# Patient Record
Sex: Male | Born: 1945
Health system: Southern US, Community
[De-identification: ages and names within clinical notes are randomized; demographics above are authoritative.]

## PROBLEM LIST (undated history)

## (undated) DIAGNOSIS — M1990 Unspecified osteoarthritis, unspecified site: Secondary | ICD-10-CM

## (undated) DIAGNOSIS — R112 Nausea with vomiting, unspecified: Secondary | ICD-10-CM

## (undated) DIAGNOSIS — C801 Malignant (primary) neoplasm, unspecified: Secondary | ICD-10-CM

## (undated) DIAGNOSIS — G8929 Other chronic pain: Secondary | ICD-10-CM

## (undated) DIAGNOSIS — M549 Dorsalgia, unspecified: Secondary | ICD-10-CM

## (undated) DIAGNOSIS — I10 Essential (primary) hypertension: Secondary | ICD-10-CM

## (undated) DIAGNOSIS — Z9889 Other specified postprocedural states: Secondary | ICD-10-CM

## (undated) DIAGNOSIS — R1310 Dysphagia, unspecified: Secondary | ICD-10-CM

## (undated) DIAGNOSIS — K5792 Diverticulitis of intestine, part unspecified, without perforation or abscess without bleeding: Secondary | ICD-10-CM

## (undated) DIAGNOSIS — K922 Gastrointestinal hemorrhage, unspecified: Secondary | ICD-10-CM

## (undated) DIAGNOSIS — Z9289 Personal history of other medical treatment: Secondary | ICD-10-CM

## (undated) HISTORY — PX: APPENDECTOMY: SHX54

## (undated) HISTORY — DX: Gastrointestinal hemorrhage, unspecified: K92.2

## (undated) HISTORY — DX: Dorsalgia, unspecified: M54.9

## (undated) HISTORY — PX: COLONOSCOPY W/ BIOPSIES AND POLYPECTOMY: SHX1376

## (undated) HISTORY — DX: Other chronic pain: G89.29

## (undated) HISTORY — DX: Essential (primary) hypertension: I10

## (undated) HISTORY — PX: CHOLECYSTECTOMY: SHX55

## (undated) HISTORY — PX: HERNIA REPAIR: SHX51

---

## 1997-06-14 ENCOUNTER — Ambulatory Visit (HOSPITAL_COMMUNITY): Admission: RE | Admit: 1997-06-14 | Discharge: 1997-06-14 | Payer: Self-pay | Admitting: Gastroenterology

## 1998-02-19 HISTORY — PX: CERVICAL FUSION: SHX112

## 1999-03-08 ENCOUNTER — Encounter: Payer: Self-pay | Admitting: Neurosurgery

## 1999-03-10 ENCOUNTER — Encounter: Payer: Self-pay | Admitting: Neurosurgery

## 1999-03-10 ENCOUNTER — Inpatient Hospital Stay (HOSPITAL_COMMUNITY): Admission: RE | Admit: 1999-03-10 | Discharge: 1999-03-10 | Payer: Self-pay | Admitting: Neurosurgery

## 1999-03-27 ENCOUNTER — Encounter: Admission: RE | Admit: 1999-03-27 | Discharge: 1999-03-27 | Payer: Self-pay | Admitting: Neurosurgery

## 1999-03-27 ENCOUNTER — Encounter: Payer: Self-pay | Admitting: Neurosurgery

## 1999-05-11 ENCOUNTER — Encounter: Payer: Self-pay | Admitting: Neurosurgery

## 1999-05-11 ENCOUNTER — Encounter: Admission: RE | Admit: 1999-05-11 | Discharge: 1999-05-11 | Payer: Self-pay | Admitting: Neurosurgery

## 2000-03-29 ENCOUNTER — Ambulatory Visit (HOSPITAL_COMMUNITY): Admission: RE | Admit: 2000-03-29 | Discharge: 2000-03-29 | Payer: Self-pay | Admitting: Gastroenterology

## 2006-02-19 DIAGNOSIS — K922 Gastrointestinal hemorrhage, unspecified: Secondary | ICD-10-CM

## 2006-02-19 HISTORY — DX: Gastrointestinal hemorrhage, unspecified: K92.2

## 2008-01-21 ENCOUNTER — Encounter: Admission: RE | Admit: 2008-01-21 | Discharge: 2008-01-21 | Payer: Self-pay | Admitting: Surgery

## 2009-06-23 ENCOUNTER — Encounter: Payer: Self-pay | Admitting: Cardiology

## 2010-07-07 NOTE — Procedures (Signed)
[place]  Patient:    Jeffery Harvey, Jeffery Harvey                     MRN: [id number] Proc. Date: 03/29/00 Adm. Date:  [id number] Attending:  Orland Mustard CC:         Teena Irani. Arlyce Dice, M.D.                           Procedure Report  PROCEDURE:  Colonoscopy.  MEDICATIONS:  Fentanyl 100 mcg, Versed 10 mg IV.  ENDOSCOPE:  Adult Olympus video colonoscope.  INDICATIONS:  Done as a follow-up fora history of previous colonic polyps.  DESCRIPTION OF PROCEDURE:  The procedure had been explained to the patient and consent obtained.  Withthe patient in the left lateral decubitus position, the Olympus adult video colonoscope inserted, advanced under direct visualization.  The prep was excellent, and we advanced to the cecum without difficulty.  The cecum was identified by the identification of the ileocecal valve and appendiceal orifice.  The scope was then withdrawn.  The cecum, ascending colon,hepatic flexure, splenic flexure, descending, and sigmoid colon all seenwell with no further polyps.  The scope was withdrawn, and the patient tolerated the procedure well, was maintained on low-flow oxygen and pulse oximetry throughout the procedure with no obvious problems.  ASSESSMENT:  No evidence of further colon polyps.  PLAN:  Will recommend repeating in three years and will go ahead with yearly Hemoccults. DD:  03/29/00 TD:  03/30/00 Job: [id number]

## 2010-07-07 NOTE — H&P (Signed)
[place]  Patient:    Jeffery Harvey                      MRN: [id number] Adm. Date:  [id number] Attending:  Danella Penton Dictator:   Tanya Nones. Jeral Fruit, M.D.                         History and Physical  CHIEF COMPLAINT:  Mr. Jeffery Harvey is a gentleman who was seen by me a year ago because of neck pain with painful sensation and weakness with discomfort going to the left  upper extremity.  HISTORY OF PRESENT ILLNESS:  At the beginning he had a complete cardiovascular work-up, which was essentially negative.  He had an MRI and he was sent to [place] for evaluation.  I saw him on June 17, 1998 and I followed him in my office on several occasions.  Nevertheless, he continued to have a lot of weakness of the triceps but was reluctant to go ahead with surgery.  A few days ago he came to my office and, indeed, he was not any better, probably a bit weaker.  He decided to go ahead with surgery.  He denies any pain in the right upper extremity, any pain in the lower extremity, or any problem with bladder or bowel.  PAST MEDICAL HISTORY:  Appendectomy five years ago.  He has a peptic ulcer and as had a polyp removed from the colon.  ALLERGIES:  No known drug allergies.  SOCIAL HISTORY:  He does not smoke and does not drink.  FAMILY HISTORY:  There is a history of cancer of the colon and strokes in his family.  REVIEW OF SYSTEMS:  He has a history of hearing loss, broken ankle.  He has indigestion.  Occasionally he has lower back pain.  PHYSICAL EXAMINATION:  HEENT:  Normal.  NECK:  He is able to flex, extensor lateralization produces pain that goes to the left shoulder.  There are no bruits in the carotids.  LUNGS:  Clear.  HEART:  Heart sounds normal.  ABDOMEN:  Normal.  EXTREMITIES:  Normal pulses.  NEUROLOGIC:  Mental status normal.  Cranial nerves normal.  Strength in the right upper extremity normal, in the left upper  extremity deltoid, biceps, and wrist extensors normal.  He has 2/5 weakness of the left triceps, weakness of the hyperthenar muscle.  Reflexes are symmetrical with absent at the left triceps.  Sensation normal although he complains of some tingling sensation going to the eft arm.  Coordination and gait normal.  The MRI showed that indeed this gentleman has at the level of C6-7 spondylosis ith a herniated disk, central and to the left.  CLINICAL IMPRESSION: 1.  C6-C7 herniated disk with left C7 radiculopathy, chronic.  RECOMMENDATIONS:  The patient is being admitted for surgery.  The procedure will be anterior C6-7 diskectomy followed by fusion.  He knows about the risks such as infection, CSF leak, worsening of pain, paralysis, stroke, damage to the vocal cord, damage to the esophagus. DD:  03/10/99 TD:  03/10/99 Job: [id number]

## 2010-11-15 DIAGNOSIS — M545 Low back pain, unspecified: Secondary | ICD-10-CM | POA: Insufficient documentation

## 2011-09-07 ENCOUNTER — Encounter: Payer: Self-pay | Admitting: Cardiology

## 2011-10-08 ENCOUNTER — Encounter: Payer: Self-pay | Admitting: Nurse Practitioner

## 2011-10-08 ENCOUNTER — Ambulatory Visit (INDEPENDENT_AMBULATORY_CARE_PROVIDER_SITE_OTHER): Payer: Medicare Other | Admitting: Nurse Practitioner

## 2011-10-08 VITALS — BP 140/90 | HR 51 | Ht 75.0 in | Wt 209.4 lb

## 2011-10-08 DIAGNOSIS — I10 Essential (primary) hypertension: Secondary | ICD-10-CM

## 2011-10-08 MED ORDER — NEBIVOLOL HCL 10 MG PO TABS: 10.0000 mg | ORAL_TABLET | Freq: Every day | ORAL | Status: DC

## 2011-10-08 MED ORDER — OLMESARTAN MEDOXOMIL 40 MG PO TABS: 40.0000 mg | ORAL_TABLET | Freq: Every day | ORAL | Status: DC

## 2011-10-08 NOTE — Patient Instructions (Signed)
Goal blood pressure is less than 135/85.   Lets stop the Benicar HCT  We will use plain Benicar 40 mg a day  We will increase the Bystolic to 10 mg a day.  Monitor your blood pressure and heart rate at home and keep a diary  Minimize your salt use.  Call us for any lightheadedness or dizziness  Call the [facility] office at [phone] if you have any questions, problems or concerns.

## 2011-10-08 NOTE — Assessment & Plan Note (Signed)
Blood pressure is up here today. He has not been checking at home. He would like to come off the HCTZ to see if his gout symptoms will improve. I have changed him to plain Benicar 40 mg. I have increased his Bystolic to 10 mg. Hopefully we will not see worsening bradycardia or symptoms. I have asked him to start monitoring his blood pressure and pulse at home. He sees [name] in 4 weeks and he was given samples today to last until that visit. He has not tolerated Lotrel in the past due to swelling, so I would avoid Norvasc. Could consider Hydralazine if needed. He understands the need for sodium restriction. Patient is agreeable to this plan and will call if any problems develop in the interim.

## 2011-10-08 NOTE — Progress Notes (Signed)
   Jeffery Harvey Date of Birth: [birth year] Medical Record #[id number]  History of Present Illness: Jeffery Harvey is seen back today for a work in visit. He is seen for [name], whom he will meet next month for consultation. He is a former patient of [name]. He has HTN, gout and occasion GERD. He has no known CAD.   He comes in today. It has been recommended that he come off of his HCTZ due to his gout. He continues to have flareups. He is on Uloric. He will use Indocin prn. He has no chest pain or shortness of breath. He has lost weight since his last visit with [name] in May of 2011. Weight then was 224. He has stopped checking his blood pressure at home. He tolerates his medicines well. Not lightheaded or dizzy. He does admit to excessive salt use. No swelling reported. He had complete lab work with [name] about a month ago.   Current Outpatient Prescriptions on File Prior to Visit  Medication Sig Dispense Refill  . Febuxostat (ULORIC) 80 MG TABS Take 80 mg by mouth 2 (two) times daily.      . INDOMETHACIN PO Take 50 mg by mouth 2 (two) times daily. PT ON TWICE DAILY FOR 1 MORE WEEK THEN WILL RESUME TO DAILY      . ranitidine (ZANTAC) 150 MG tablet Take 150 mg by mouth as needed.      . zolpidem (AMBIEN) 10 MG tablet Take 10 mg by mouth at bedtime as needed.      Marland Kitchen olmesartan (BENICAR) 40 MG tablet Take 1 tablet (40 mg total) by mouth daily.  30 tablet  11    Allergies  Allergen Reactions  . Amlodipine Besy-Benazepril Hcl   . Vioxx (Rofecoxib)     Past Medical History  Diagnosis Date  . GI bleed   . Gout   . Hypertension     Past Surgical History  Procedure Date  . Cervical fusion 2000  . Hernia repair     Bilateral  . Appendectomy   . Colonoscopy w/ biopsies and polypectomy     History  Smoking status  . Former Smoker  . Types: Cigarettes  Smokeless tobacco  . Not on file    History  Alcohol Use  . 2.5 oz/week  . 5 drink(s) per week    WINE     Family History  Problem Relation Age of Onset  . Other Father 2    Blood Disorder  . Heart failure Mother 53  . Colon cancer Mother   . Hypertension Sister 78    Valve repaired    Review of Systems: The review of systems is per the HPI.  All other systems were reviewed and are negative.  Physical Exam: BP 140/90  Pulse 51  Ht 6\' 3"  (1.905 m)  Wt 209 lb 6.4 oz (94.983 kg)  BMI 26.17 kg/m2 Weight is down from 224.  Patient is very pleasant and in no acute distress. Skin is warm and dry. Color is normal.  HEENT is unremarkable. Normocephalic/atraumatic. PERRL. Sclera are nonicteric. Neck is supple. No masses. No JVD. Lungs are clear. Cardiac exam shows a regular rate and rhythm. Abdomen is soft. Extremities are without edema. Gait and ROM are intact. No gross neurologic deficits noted.   LABORATORY DATA: N/A  EKG today shows sinus bradycardia.   Assessment / Plan:

## 2011-11-05 ENCOUNTER — Telehealth: Payer: Self-pay | Admitting: Cardiology

## 2011-11-05 DIAGNOSIS — I119 Hypertensive heart disease without heart failure: Secondary | ICD-10-CM

## 2011-11-05 MED ORDER — HYDRALAZINE HCL 25 MG PO TABS: 25.0000 mg | ORAL_TABLET | Freq: Two times a day (BID) | ORAL | Status: DC

## 2011-11-05 NOTE — Telephone Encounter (Signed)
New problem:  Blood pressure recently  Changed by Jeffery Harvey in August.  No er visit. Medication was taken. H/O gout. Taken benicar  40 mg. bystolic was increase 10 mg.  No more sample of Benicar. Please advise.    B/p 180/114  Today - pulse rate 54   Yesterday  183/118. - pulse rate 46.

## 2011-11-05 NOTE — Telephone Encounter (Signed)
Has not tolerated Lotrel in the past. So would avoid Norvasc. We can try Hydralazine 25 mg BID. See on Thursday as planned with his readings.

## 2011-11-05 NOTE — Telephone Encounter (Signed)
Takes Bystolic at night with evening meal and Benicar in the am.   Blood pressure taken at different times during day and numbers are still high.  Is scheduled to see Dr Swaziland on Thursday.  Did take last Benicar this am and last Bystolic tonight.  Is very concerned about how high blood pressure is.  Will forward to [facility] for review.  Would like something else to help and would prefer something we have samples of.  Does have appointment this afternoon with PCP for problems with sleep, not sleeping well and is very stressed at work.

## 2011-11-05 NOTE — Telephone Encounter (Signed)
F/U  Wife calling back regarding samples.   Wife would like a call back.

## 2011-11-05 NOTE — Telephone Encounter (Signed)
Advised wife and put samples up front.  Wife will bring readings and blood pressure machine to appointment with Dr Swaziland on 9/19 Benicar 40 mg samples lot # [id number] exp 9/15 x 3 Bystolic 10 mg  Lot # [id number] exp 5/15

## 2011-11-08 ENCOUNTER — Encounter: Payer: Self-pay | Admitting: Cardiology

## 2011-11-08 ENCOUNTER — Ambulatory Visit (INDEPENDENT_AMBULATORY_CARE_PROVIDER_SITE_OTHER): Payer: Medicare Other | Admitting: Cardiology

## 2011-11-08 VITALS — BP 110/80 | HR 54 | Ht 75.0 in | Wt 201.0 lb

## 2011-11-08 DIAGNOSIS — M109 Gout, unspecified: Secondary | ICD-10-CM

## 2011-11-08 DIAGNOSIS — I10 Essential (primary) hypertension: Secondary | ICD-10-CM

## 2011-11-08 NOTE — Progress Notes (Signed)
Jeffery Harvey Date of Birth: 02-Dec-1945 Medical Record #[id number]  History of Present Illness: Jeffery Harvey is seen back today for a followup visit. He has HTN, gout and occasion GERD. He has no known CAD. Echocardiogram and Cardiolite studies in May of 2012 are unremarkable. He was seen one month ago. At that time he had increased difficulty with gout. His HCTZ was discontinued. He was more hypertensive. His bystolic was increased to 10 mg daily. He has remained on Benicar 40 mg daily. His blood pressure has continued to fluctuate. This past weekend his blood pressure increased significantly up to 180/114. He does note that he has been taking Indocin daily for the past 3 months. This was discontinued. He was started on hydralazine 3 days ago and since then his blood pressure has improved. He is feeling well. He has no chest pain or shortness of breath. He has been following a low-sodium diet and has reduced his caffeine intake.    Current Outpatient Prescriptions on File Prior to Visit  Medication Sig Dispense Refill  . Febuxostat (ULORIC) 80 MG TABS Take 80 mg by mouth daily.       . hydrALAZINE (APRESOLINE) 25 MG tablet Take 1 tablet (25 mg total) by mouth 2 (two) times daily.  60 tablet  5  . INDOMETHACIN PO Take 50 mg by mouth 2 (two) times daily. PT ON TWICE DAILY FOR 1 MORE WEEK THEN WILL RESUME TO DAILY      . nebivolol (BYSTOLIC) 10 MG tablet Take 1 tablet (10 mg total) by mouth daily.      Marland Kitchen olmesartan (BENICAR) 40 MG tablet Take 1 tablet (40 mg total) by mouth daily.  30 tablet  11  . ranitidine (ZANTAC) 150 MG tablet Take 150 mg by mouth as needed.      . zolpidem (AMBIEN) 10 MG tablet Take 10 mg by mouth at bedtime as needed.        Allergies  Allergen Reactions  . Amlodipine Besy-Benazepril Hcl   . Vioxx (Rofecoxib)     Past Medical History  Diagnosis Date  . GI bleed   . Gout   . Hypertension     Past Surgical History  Procedure Date  . Cervical fusion 2000  . Hernia  repair     Bilateral  . Appendectomy   . Colonoscopy w/ biopsies and polypectomy     History  Smoking status  . Former Smoker  . Types: Cigarettes  Smokeless tobacco  . Not on file    History  Alcohol Use  . 2.5 oz/week  . 5 drink(s) per week    WINE    Family History  Problem Relation Age of Onset  . Other Father 59    Blood Disorder  . Heart failure Mother 58  . Colon cancer Mother   . Hypertension Sister 44    Valve repaired    Review of Systems: The review of systems is per the HPI.  All other systems were reviewed and are negative.  Physical Exam: BP 110/80  Pulse 54  Ht 6\' 3"  (1.905 m)  Wt 201 lb (91.173 kg)  BMI 25.12 kg/m2 Weight is down from 224.  Patient is very pleasant and in no acute distress. Skin is warm and dry. Color is normal.  HEENT is unremarkable. Normocephalic/atraumatic. PERRL. Sclera are nonicteric. Neck is supple. No masses. No JVD. Lungs are clear. Cardiac exam shows a regular rate and rhythm. Abdomen is soft. Extremities are without edema. Gait and  ROM are intact. No gross neurologic deficits noted.   LABORATORY DATA:    Assessment / Plan: 1. Hypertension with recent acceleration. I suspect this may be related to his daily use of Indocin. Since his gout has improved we will discontinue this. We will continue with hydralazine as his blood pressure has improved. Continue his current doses of Benicar and Bystolic. I will have him followup again in one month. I've requested copies of his lab work from [name]. If his blood pressure remains significantly elevated we may need to consider evaluation for secondary causes of hypertension.

## 2011-11-08 NOTE — Patient Instructions (Signed)
Continue your current therapy  We will see you again in one month.

## 2011-11-12 ENCOUNTER — Telehealth: Payer: Self-pay | Admitting: Cardiology

## 2011-11-12 NOTE — Telephone Encounter (Signed)
New Problem:    Patient's wife called in wanting to speak with you about her husbands blood pressure medications.  After having lisinopril on Saturday his BP dropped down to 82/51.  Sunday his morning BP was 128/82, afternoon 111/69, evening101/62, did not take medication. This morning his BP it is 144/96 and still has not taken his medication today. Patient's wife wanted a call back today.  Please call back.

## 2011-11-12 NOTE — Telephone Encounter (Signed)
Spoke to patient's wife she stated patient's B/P dropped this weekend and he held his hydralazine.States B/P this past Sat morning when he got up was 102/69 P 55.After taking am meds B/P 82/51 p 61 pt felt light headed and dizzy,blurred vision.States at 6:30 pm Sat B/P 110/70,held pm dose of hydralazine,at 8:45 pm B/P 103/66.States Sunday morning B/P 120/82,4:45 pm yesterday B/P 101/62.States this morning B/P 144/96.States he did not take hydralazine,wants to know what he should do.Wife was told Dr.Jordan not in office today,will check with him tomorrow and call back.Wife will check patient's B/P when he comes home from work today and if still elevated she will give him his pm dose of hydralazine.

## 2011-11-13 NOTE — Telephone Encounter (Signed)
Patient called spoke to wife was told Dr.Jordan advised to decrease Hydralazine to 25mg  1/2 tablet twice daily.Advised to continue to monitor B/P and call back if needed.

## 2011-11-26 ENCOUNTER — Other Ambulatory Visit: Payer: Self-pay | Admitting: Cardiology

## 2011-11-26 ENCOUNTER — Other Ambulatory Visit: Payer: Self-pay

## 2011-11-26 MED ORDER — OLMESARTAN MEDOXOMIL 40 MG PO TABS: 40.0000 mg | ORAL_TABLET | Freq: Every day | ORAL | Status: DC

## 2011-12-06 ENCOUNTER — Ambulatory Visit (INDEPENDENT_AMBULATORY_CARE_PROVIDER_SITE_OTHER): Payer: Medicare Other | Admitting: Cardiology

## 2011-12-06 ENCOUNTER — Encounter: Payer: Self-pay | Admitting: Cardiology

## 2011-12-06 VITALS — BP 124/78 | HR 59 | Ht 72.0 in | Wt 200.8 lb

## 2011-12-06 DIAGNOSIS — I119 Hypertensive heart disease without heart failure: Secondary | ICD-10-CM

## 2011-12-06 DIAGNOSIS — I10 Essential (primary) hypertension: Secondary | ICD-10-CM

## 2011-12-06 MED ORDER — HYDRALAZINE HCL 25 MG PO TABS: 25.0000 mg | ORAL_TABLET | Freq: Two times a day (BID) | ORAL | Status: DC

## 2011-12-06 MED ORDER — CARVEDILOL 12.5 MG PO TABS: 12.5000 mg | ORAL_TABLET | Freq: Two times a day (BID) | ORAL | Status: DC

## 2011-12-06 NOTE — Patient Instructions (Signed)
When you run out of Bystolic we will switch to carvedilol 12.5 mg twice a day.  Continue otherwise what you are doing.  I will see you in 4 months

## 2011-12-06 NOTE — Progress Notes (Signed)
[name] Date of Birth: 1945/12/30 Medical Record #[id number]  History of Present Illness: Jeffery Harvey is seen back today for a followup visit. He has HTN, gout and occasion GERD. He has no known CAD. Echocardiogram and Cardiolite studies in May of 2012 are unremarkable. On followup today he reports that he is doing well. We placed him on hydralazine for blood pressure control. Initially he was having elevated blood pressure readings in the morning and then drops in his blood pressure in the evening. He was sometimes symptomatic with hypotension. He switched to taking his hydralazine  a half tablet in the morning and one tablet at night. Since then his blood pressure has been much better with fairly consistent control. His wife is concerned about the cost of his medication and wants to see about getting him on generic medications as much as possible.    Current Outpatient Prescriptions on File Prior to Visit  Medication Sig Dispense Refill  . Febuxostat (ULORIC) 80 MG TABS Take 80 mg by mouth daily.       Marland Kitchen olmesartan (BENICAR) 40 MG tablet Take 1 tablet (40 mg total) by mouth daily.  30 tablet  11  . oxyCODONE-acetaminophen (PERCOCET) 10-325 MG per tablet as directed.      . ranitidine (ZANTAC) 150 MG tablet Take 150 mg by mouth as needed.      . zolpidem (AMBIEN) 10 MG tablet Take 10 mg by mouth at bedtime as needed.      Marland Kitchen DISCONTD: hydrALAZINE (APRESOLINE) 25 MG tablet Take 1 tablet (25 mg total) by mouth 2 (two) times daily.  60 tablet  5  . carvedilol (COREG) 12.5 MG tablet Take 1 tablet (12.5 mg total) by mouth 2 (two) times daily.  180 tablet  3    Allergies  Allergen Reactions  . Amlodipine Besy-Benazepril Hcl   . Vioxx (Rofecoxib)     Past Medical History  Diagnosis Date  . GI bleed   . Gout   . Hypertension     Past Surgical History  Procedure Date  . Cervical fusion 2000  . Hernia repair     Bilateral  . Appendectomy   . Colonoscopy w/ biopsies and polypectomy       History  Smoking status  . Former Smoker  . Types: Cigarettes  Smokeless tobacco  . Not on file    History  Alcohol Use  . 2.5 oz/week  . 5 drink(s) per week    WINE    Family History  Problem Relation Age of Onset  . Other Father 79    Blood Disorder  . Heart failure Mother 4  . Colon cancer Mother   . Hypertension Sister 49    Valve repaired    Review of Systems: The review of systems is per the HPI.  All other systems were reviewed and are negative.  Physical Exam: BP 124/78  Pulse 59  Ht 6' (1.829 m)  Wt 200 lb 12.8 oz (91.082 kg)  BMI 27.23 kg/m2  SpO2 94% Weight is down from 224.  Patient is very pleasant and in no acute distress. Skin is warm and dry. Color is normal.  HEENT is unremarkable. Normocephalic/atraumatic. PERRL. Sclera are nonicteric. Neck is supple. No masses. No JVD. Lungs are clear. Cardiac exam shows a regular rate and rhythm. Abdomen is soft. Extremities are without edema. Gait and ROM are intact. No gross neurologic deficits noted.   LABORATORY DATA:    Assessment / Plan: 1. Hypertension blood pressure  has improved significantly with the addition of hydralazine. Given concerns about the cost of his medications we will try to switch his bystolic to carvedilol 12.5 mg twice a day. We will continue with Benicar for now. Losartan is the only generic ARB available and I think is clearly inferior in potency. I'll followup again in 4 months.

## 2012-03-20 ENCOUNTER — Encounter: Payer: Self-pay | Admitting: Cardiology

## 2012-04-01 ENCOUNTER — Ambulatory Visit: Payer: Medicare Other | Admitting: Cardiology

## 2012-04-24 ENCOUNTER — Ambulatory Visit (INDEPENDENT_AMBULATORY_CARE_PROVIDER_SITE_OTHER): Payer: Medicare Other | Admitting: Cardiology

## 2012-04-24 ENCOUNTER — Encounter: Payer: Self-pay | Admitting: Cardiology

## 2012-04-24 VITALS — BP 142/80 | HR 64 | Ht 74.0 in | Wt 204.1 lb

## 2012-04-24 DIAGNOSIS — I10 Essential (primary) hypertension: Secondary | ICD-10-CM

## 2012-04-24 NOTE — Progress Notes (Signed)
   [name] Date of Birth: 12/14/1945 Medical Record #[id number]  History of Present Illness: Jeffery Harvey is seen back today for a followup visit. He has HTN, gout and occasion GERD. He has no known CAD. Echocardiogram and Cardiolite studies in May of 2012 are unremarkable. On followup today he reports he has been under a lot of stress. He has had 2 injections recently for back pain with much improvement. Blood pressure readings over the last 2 months have been generally very good but have ranged as high as 160/106 and as low and 81/50.     Current Outpatient Prescriptions on File Prior to Visit  Medication Sig Dispense Refill  . carvedilol (COREG) 12.5 MG tablet Take 1 tablet (12.5 mg total) by mouth 2 (two) times daily.  180 tablet  3  . Febuxostat (ULORIC) 80 MG TABS Take 80 mg by mouth daily.       Marland Kitchen olmesartan (BENICAR) 40 MG tablet Take 1 tablet (40 mg total) by mouth daily.  30 tablet  11  . oxyCODONE-acetaminophen (PERCOCET) 10-325 MG per tablet as directed.      . ranitidine (ZANTAC) 150 MG tablet Take 150 mg by mouth as needed.      . zolpidem (AMBIEN) 10 MG tablet Take 10 mg by mouth at bedtime as needed.       No current facility-administered medications on file prior to visit.    Allergies  Allergen Reactions  . Amlodipine Besy-Benazepril Hcl   . Vioxx (Rofecoxib)     Past Medical History  Diagnosis Date  . GI bleed   . Gout   . Hypertension     Past Surgical History  Procedure Laterality Date  . Cervical fusion  2000  . Hernia repair      Bilateral  . Appendectomy    . Colonoscopy w/ biopsies and polypectomy      History  Smoking status  . Former Smoker  . Types: Cigarettes  Smokeless tobacco  . Not on file    History  Alcohol Use  . 2.5 oz/week  . 5 drink(s) per week    Comment: WINE    Family History  Problem Relation Age of Onset  . Other Father 16    Blood Disorder  . Heart failure Mother 26  . Colon cancer Mother   . Hypertension  Sister 61    Valve repaired    Review of Systems: The review of systems is per the HPI.  All other systems were reviewed and are negative.  Physical Exam: BP 142/80  Pulse 64  Ht 6\' 2"  (1.88 m)  Wt 204 lb 1.9 oz (92.588 kg)  BMI 26.2 kg/m2  SpO2 97% Patient is very pleasant and in no acute distress. Skin is warm and dry.  HEENT is unremarkable. Normocephalic/atraumatic. PERRL. Sclera are nonicteric. Neck is supple. No masses. No JVD. Lungs are clear. Cardiac exam shows a regular rate and rhythm. Abdomen is soft. Extremities are without edema. Gait and ROM are intact. No gross neurologic deficits noted.   LABORATORY DATA:    Assessment / Plan: 1. Hypertension- Blood pressure has improved. Will continue carvedilol, hydralazine and Benicar. If BP drops he is to hold hydralazine. Follow up in 6 months.

## 2012-04-24 NOTE — Patient Instructions (Signed)
Continue your current therapy.  If your blood pressure drops I would hold your next hydralazine dose.

## 2012-07-04 ENCOUNTER — Other Ambulatory Visit: Payer: Self-pay | Admitting: Cardiology

## 2012-07-07 ENCOUNTER — Other Ambulatory Visit: Payer: Self-pay

## 2012-07-07 NOTE — Telephone Encounter (Signed)
Assessment / Plan: 1. Hypertension- Blood pressure has improved. Will continue carvedilol, hydralazine and Benicar. If BP drops he is to hold hydralazine. Follow up in 6 months.     Patient Instructions  Continue your current therapy.  If your blood pressure drops I would hold your next hydralazine dose.  Chart Reviewed By  Charna Elizabeth, LPN  on 1/61/0960 11:47 AM     Previous Visit     Provider Department Encounter #  04/01/2012 10:00 AM Peter Swaziland, MD [facility] [id number]

## 2012-07-09 ENCOUNTER — Telehealth: Payer: Self-pay | Admitting: Cardiology

## 2012-07-09 MED ORDER — HYDRALAZINE HCL 25 MG PO TABS: 37.5000 mg | ORAL_TABLET | Freq: Every day | ORAL | Status: DC

## 2012-07-09 NOTE — Telephone Encounter (Signed)
Returned call to patient spoke to wife she stated husband needs refills on hydralazine.Refills sent to pharmacy.

## 2012-07-09 NOTE — Telephone Encounter (Signed)
New problem    Pt tried to get his BP Hydralazine 25mg  refill at the [facility] and he was told that Dr Swaziland said that he didn't need a refill on it. Please call pt because pt will run out today.

## 2012-09-24 ENCOUNTER — Other Ambulatory Visit: Payer: Self-pay

## 2012-10-23 ENCOUNTER — Encounter: Payer: Self-pay | Admitting: Cardiology

## 2012-10-23 ENCOUNTER — Ambulatory Visit (INDEPENDENT_AMBULATORY_CARE_PROVIDER_SITE_OTHER): Payer: Medicare Other | Admitting: Cardiology

## 2012-10-23 VITALS — BP 120/68 | HR 61 | Ht 74.0 in | Wt 209.8 lb

## 2012-10-23 DIAGNOSIS — I10 Essential (primary) hypertension: Secondary | ICD-10-CM

## 2012-10-23 NOTE — Patient Instructions (Signed)
Continue your current therapy  I will see you in 6 months.   

## 2012-10-23 NOTE — Progress Notes (Signed)
Jeffery Harvey Date of Birth: February 10, 1946 Medical Record #[id number]  History of Present Illness: Jeffery Harvey is seen back today for a followup visit. He has HTN, gout and occasion GERD. He has no known CAD. Echocardiogram and Cardiolite studies in May of 2012 are unremarkable. On followup today he reports he is doing very well. He denies any symptoms of chest pain or shortness of breath. He has been monitoring his blood pressure at home and it has been well controlled with typical readings running from 122-140 systolic. He rarely has episodes of hypotension. He was started on Cymbalta for back pain and he notes that this has helped him deal with stress.    Current Outpatient Prescriptions on File Prior to Visit  Medication Sig Dispense Refill  . carvedilol (COREG) 12.5 MG tablet Take 1 tablet (12.5 mg total) by mouth 2 (two) times daily.  180 tablet  3  . Febuxostat (ULORIC) 80 MG TABS Take 80 mg by mouth daily.       . hydrALAZINE (APRESOLINE) 25 MG tablet Take 1.5 tablets (37.5 mg total) by mouth daily. Take 0.5 tablet in the am and 1 tablet in the pm.  60 tablet  11  . olmesartan (BENICAR) 40 MG tablet Take 1 tablet (40 mg total) by mouth daily.  30 tablet  11  . oxyCODONE-acetaminophen (PERCOCET) 10-325 MG per tablet as directed.      . ranitidine (ZANTAC) 150 MG tablet Take 150 mg by mouth as needed.      . zolpidem (AMBIEN) 10 MG tablet Take 10 mg by mouth at bedtime as needed.       No current facility-administered medications on file prior to visit.    Allergies  Allergen Reactions  . Amlodipine Besy-Benazepril Hcl   . Vioxx [Rofecoxib]     Past Medical History  Diagnosis Date  . GI bleed   . Gout   . Hypertension     Past Surgical History  Procedure Laterality Date  . Cervical fusion  2000  . Hernia repair      Bilateral  . Appendectomy    . Colonoscopy w/ biopsies and polypectomy      History  Smoking status  . Former Smoker  . Types: Cigarettes  Smokeless  tobacco  . Not on file    History  Alcohol Use  . 2.5 oz/week  . 5 drink(s) per week    Comment: WINE    Family History  Problem Relation Age of Onset  . Other Father 50    Blood Disorder  . Heart failure Mother 26  . Colon cancer Mother   . Hypertension Sister 64    Valve repaired    Review of Systems: The review of systems is per the HPI.  All other systems were reviewed and are negative.  Physical Exam: BP 120/68  Pulse 61  Ht 6\' 2"  (1.88 m)  Wt 209 lb 12.8 oz (95.165 kg)  BMI 26.93 kg/m2 Patient is very pleasant and in no acute distress. Skin is warm and dry.  HEENT is unremarkable. Normocephalic/atraumatic. PERRL. Sclera are nonicteric. Neck is supple. No masses. No JVD. Lungs are clear. Cardiac exam shows a regular rate and rhythm. Abdomen is soft. Extremities are without edema. Gait and ROM are intact. No gross neurologic deficits noted.   LABORATORY DATA:  ECG today demonstrates normal sinus rhythm with a normal ECG. Rate is 61 beats per minute.  Assessment / Plan: 1. Hypertension- Blood pressure is well controlled. Will continue  carvedilol, hydralazine and Benicar.

## 2012-11-01 ENCOUNTER — Other Ambulatory Visit: Payer: Self-pay | Admitting: Cardiology

## 2012-12-12 ENCOUNTER — Other Ambulatory Visit: Payer: Self-pay | Admitting: Cardiology

## 2012-12-25 ENCOUNTER — Other Ambulatory Visit: Payer: Self-pay

## 2013-05-21 DIAGNOSIS — M533 Sacrococcygeal disorders, not elsewhere classified: Secondary | ICD-10-CM | POA: Insufficient documentation

## 2013-05-21 DIAGNOSIS — M47817 Spondylosis without myelopathy or radiculopathy, lumbosacral region: Secondary | ICD-10-CM | POA: Insufficient documentation

## 2013-05-21 DIAGNOSIS — M48061 Spinal stenosis, lumbar region without neurogenic claudication: Secondary | ICD-10-CM | POA: Insufficient documentation

## 2013-07-21 ENCOUNTER — Telehealth: Payer: Self-pay | Admitting: Cardiology

## 2013-07-21 NOTE — Telephone Encounter (Signed)
Returned call to patient's wife.She stated husband has been dieting and has lost 15 lbs within 3 weeks.Stated he has been dizzy.Stated B/P has been low ranging 80/50,90/54,97/61,pulse ranging in 60's.Stated husband has been holding B/P medications.Stated she made him a appointment with Truitt Merle NP 08/18/13,but wanted him seen sooner.Appointment scheduled with Dr.Jordan 07/22/13 at 4:30 pm.

## 2013-07-21 NOTE — Telephone Encounter (Signed)
New Message:  Pt's wife states in the past week her husband has lost about 15 lbs... States he has been really dizzy lately and has had low BP... 5/25 it was 94/51 with him taking his medication.. 5/30 it was 80/51 after taking his med...  5/31 at 10 am it was 101/68.. That afternoon 106/67 without taking his meds.. PT's wife is requesting a call back from [place] to discuss her husbands BP and to see if he can be worked in any sooner.

## 2013-07-22 ENCOUNTER — Ambulatory Visit (INDEPENDENT_AMBULATORY_CARE_PROVIDER_SITE_OTHER): Payer: Medicare Other | Admitting: Cardiology

## 2013-07-22 ENCOUNTER — Encounter: Payer: Self-pay | Admitting: Cardiology

## 2013-07-22 VITALS — BP 131/83 | HR 61 | Ht 74.0 in | Wt 210.0 lb

## 2013-07-22 DIAGNOSIS — I10 Essential (primary) hypertension: Secondary | ICD-10-CM

## 2013-07-22 NOTE — Patient Instructions (Signed)
Stop taking hydralazine   Continue your other therapy  I will see you in 6 months. Call if you continue to have low blood pressure.

## 2013-07-23 NOTE — Progress Notes (Signed)
   Jeffery Harvey Date of Birth: May 14, 1945 Medical Record #[id number]  History of Present Illness: Jeffery Harvey is seen back today for a followup visit. He has HTN, gout and occasion GERD. He has no known CAD. Echocardiogram and Cardiolite studies in May of 2012 are unremarkable. Recently he was following a more strict diet in order to lose weight. He has chronic back pain and was trying to do all he could to help. He lost 9 lbs. With this he also developed Hypotension with BP down to 80/51. He did not feel well with this and held his BP meds for a couple of days. BP has been well controlled. No complaints of chest pain or SOB.    Current Outpatient Prescriptions on File Prior to Visit  Medication Sig Dispense Refill  . BENICAR 40 MG tablet TAKE 1 TABLET EVERY DAY  30 tablet  11  . carvedilol (COREG) 12.5 MG tablet TAKE 1 TABLET BY MOUTH TWICE A DAY  180 tablet  2  . DULoxetine (CYMBALTA) 30 MG capsule Take 60 mg by mouth daily.       . Febuxostat (ULORIC) 80 MG TABS Take 80 mg by mouth daily.       . ranitidine (ZANTAC) 150 MG tablet Take 150 mg by mouth as needed.      . zolpidem (AMBIEN) 10 MG tablet Take 10 mg by mouth at bedtime as needed.       No current facility-administered medications on file prior to visit.    Allergies  Allergen Reactions  . Amlodipine Besy-Benazepril Hcl   . Vioxx [Rofecoxib]     Past Medical History  Diagnosis Date  . GI bleed   . Gout   . Hypertension   . Chronic back pain     Past Surgical History  Procedure Laterality Date  . Cervical fusion  2000  . Hernia repair      Bilateral  . Appendectomy    . Colonoscopy w/ biopsies and polypectomy      History  Smoking status  . Former Smoker  . Types: Cigarettes  Smokeless tobacco  . Not on file    History  Alcohol Use  . 2.5 oz/week  . 5 drink(s) per week    Comment: WINE    Family History  Problem Relation Age of Onset  . Other Father 2    Blood Disorder  . Heart failure Mother 58   . Colon cancer Mother   . Hypertension Sister 55    Valve repaired    Review of Systems: The review of systems is per the HPI.  All other systems were reviewed and are negative.  Physical Exam: BP 131/83  Pulse 61  Ht 6\' 2"  (1.88 m)  Wt 210 lb (95.255 kg)  BMI 26.95 kg/m2 Patient is very pleasant and in no acute distress. Skin is warm and dry.  HEENT is unremarkable. Normocephalic/atraumatic. PERRL. Sclera are nonicteric. Neck is supple. No masses. No JVD. Lungs are clear. Cardiac exam shows a regular rate and rhythm. Abdomen is soft. Extremities are without edema. Gait and ROM are intact. No gross neurologic deficits noted.   LABORATORY DATA:    Assessment / Plan: 1. Hypertension- Recent hypotension with weight loss. I have recommended stopping hydralazine and continuing Coreg and Benicar. He now understands how much of a difference his diet can make in controlling his BP. He will monitor his BP regularly.

## 2013-08-01 ENCOUNTER — Other Ambulatory Visit: Payer: Self-pay | Admitting: Cardiology

## 2013-08-18 ENCOUNTER — Ambulatory Visit: Payer: Medicare Other | Admitting: Nurse Practitioner

## 2013-09-19 HISTORY — PX: CHOLECYSTECTOMY, LAPAROSCOPIC: SHX56

## 2013-12-22 ENCOUNTER — Telehealth: Payer: Self-pay | Admitting: Cardiology

## 2013-12-22 MED ORDER — OLMESARTAN MEDOXOMIL 40 MG PO TABS: 40.0000 mg | ORAL_TABLET | Freq: Every day | ORAL | Status: DC

## 2013-12-22 NOTE — Telephone Encounter (Signed)
Rx was sent to pharmacy electronically. Wife notified.

## 2013-12-22 NOTE — Telephone Encounter (Signed)
Pt 's wife called in stating that he is out of his Benicar and that [facility] sent over a refill request yesterday and have not received a response. Please call  thanks

## 2014-02-22 ENCOUNTER — Ambulatory Visit (INDEPENDENT_AMBULATORY_CARE_PROVIDER_SITE_OTHER): Payer: PPO | Admitting: Cardiology

## 2014-02-22 ENCOUNTER — Encounter: Payer: Self-pay | Admitting: Cardiology

## 2014-02-22 VITALS — BP 102/64 | HR 67 | Ht 74.0 in | Wt 202.7 lb

## 2014-02-22 DIAGNOSIS — I10 Essential (primary) hypertension: Secondary | ICD-10-CM

## 2014-02-22 MED ORDER — LOSARTAN POTASSIUM 50 MG PO TABS: 50.0000 mg | ORAL_TABLET | Freq: Every day | ORAL | Status: DC

## 2014-02-22 NOTE — Patient Instructions (Signed)
We will switch Benicar to losartan 50 mg daily  Continue your other therapy  I will see you in one year.

## 2014-02-22 NOTE — Progress Notes (Signed)
   Jeffery Harvey Date of Birth: [birth year] Medical Record #[id number]  History of Present Illness: Jeffery Harvey is seen back today for follow up of HTN. He has HTN, gout and occasional GERD. He has no known CAD. Echocardiogram and Cardiolite studies in May of 2012 are unremarkable.  BP has been well controlled. No complaints of chest pain or SOB. He underwent cholecystectomy in August at [facility]. He does have chronic back pain. He states his Benicar is too expensive.     Current Outpatient Prescriptions on File Prior to Visit  Medication Sig Dispense Refill  . carvedilol (COREG) 12.5 MG tablet TAKE 1 TABLET BY MOUTH TWICE A DAY 180 tablet 2  . DULoxetine (CYMBALTA) 30 MG capsule Take 60 mg by mouth daily.     . Febuxostat (ULORIC) 80 MG TABS Take 40 mg by mouth daily.     . OPANA ER, CRUSH RESISTANT, 10 MG T12A 12 hr tablet Take 10 mg by mouth 2 (two) times daily.     . ranitidine (ZANTAC) 150 MG tablet Take 150 mg by mouth as needed.    . zolpidem (AMBIEN) 10 MG tablet Take 10 mg by mouth at bedtime as needed.     No current facility-administered medications on file prior to visit.    Allergies  Allergen Reactions  . Amlodipine Besy-Benazepril Hcl   . Vioxx [Rofecoxib]     Past Medical History  Diagnosis Date  . GI bleed   . Gout   . Hypertension   . Chronic back pain     Past Surgical History  Procedure Laterality Date  . Cervical fusion  2000  . Hernia repair      Bilateral  . Appendectomy    . Colonoscopy w/ biopsies and polypectomy    . Cholecystectomy, laparoscopic  8/15    History  Smoking status  . Former Smoker  . Types: Cigarettes  Smokeless tobacco  . Not on file    History  Alcohol Use  . 2.5 oz/week  . 5 drink(s) per week    Comment: WINE    Family History  Problem Relation Age of Onset  . Other Father 35    Blood Disorder  . Heart failure Mother 5  . Colon cancer Mother   . Hypertension Sister 55    Valve repaired    Review of  Systems: The review of systems is per the HPI.  All other systems were reviewed and are negative.  Physical Exam: BP 102/64 mmHg  Pulse 67  Ht 6\' 2"  (1.88 m)  Wt 202 lb 11.2 oz (91.944 kg)  BMI 26.01 kg/m2 Patient is very pleasant and in no acute distress. Skin is warm and dry.  HEENT is unremarkable. Normocephalic/atraumatic. PERRL. Sclera are nonicteric. Neck is supple. No masses. No JVD. Lungs are clear. Cardiac exam shows a regular rate and rhythm. Abdomen is soft. Extremities are without edema. Gait and ROM are intact. No gross neurologic deficits noted.   LABORATORY DATA:  Ecg: today shows NSR with rate 67. Nonspecific ST abnormality.  Assessment / Plan: 1. Hypertension- Well controlled. Will switch Benicar to losartan 50 mg daily for cost. Continue coreg. Follow up in one year. Labs reviewed from [facility] via [facility].

## 2014-04-24 DIAGNOSIS — K5792 Diverticulitis of intestine, part unspecified, without perforation or abscess without bleeding: Secondary | ICD-10-CM | POA: Insufficient documentation

## 2014-05-05 ENCOUNTER — Other Ambulatory Visit: Payer: Self-pay

## 2014-05-05 MED ORDER — CARVEDILOL 12.5 MG PO TABS: 12.5000 mg | ORAL_TABLET | Freq: Two times a day (BID) | ORAL | Status: DC

## 2014-05-07 ENCOUNTER — Other Ambulatory Visit: Payer: Self-pay | Admitting: *Deleted

## 2014-05-07 MED ORDER — CARVEDILOL 12.5 MG PO TABS: 12.5000 mg | ORAL_TABLET | Freq: Two times a day (BID) | ORAL | Status: DC

## 2014-05-18 ENCOUNTER — Encounter (HOSPITAL_COMMUNITY): Payer: Self-pay | Admitting: *Deleted

## 2014-05-25 ENCOUNTER — Other Ambulatory Visit: Payer: Self-pay | Admitting: Gastroenterology

## 2014-05-25 NOTE — Addendum Note (Signed)
Addended by: Arta Silence on: 05/25/2014 05:08 PM   Modules accepted: Orders

## 2014-05-26 ENCOUNTER — Ambulatory Visit (HOSPITAL_COMMUNITY)
Admission: RE | Admit: 2014-05-26 | Discharge: 2014-05-26 | Disposition: A | Discharge status: Home / Self Care | Payer: PPO | Source: Ambulatory Visit | Attending: Gastroenterology | Admitting: Gastroenterology

## 2014-05-26 ENCOUNTER — Encounter (HOSPITAL_COMMUNITY)
Admission: RE | Disposition: A | Discharge status: Home / Self Care | Payer: Self-pay | Source: Ambulatory Visit | Attending: Gastroenterology

## 2014-05-26 ENCOUNTER — Encounter (HOSPITAL_COMMUNITY): Payer: Self-pay

## 2014-05-26 ENCOUNTER — Encounter (HOSPITAL_COMMUNITY): Payer: Self-pay | Admitting: *Deleted

## 2014-05-26 ENCOUNTER — Ambulatory Visit (HOSPITAL_COMMUNITY): Payer: PPO | Admitting: Anesthesiology

## 2014-05-26 DIAGNOSIS — R1013 Epigastric pain: Secondary | ICD-10-CM | POA: Diagnosis present

## 2014-05-26 DIAGNOSIS — R1310 Dysphagia, unspecified: Secondary | ICD-10-CM | POA: Diagnosis present

## 2014-05-26 DIAGNOSIS — Z87891 Personal history of nicotine dependence: Secondary | ICD-10-CM | POA: Insufficient documentation

## 2014-05-26 DIAGNOSIS — R7989 Other specified abnormal findings of blood chemistry: Secondary | ICD-10-CM | POA: Diagnosis present

## 2014-05-26 DIAGNOSIS — I10 Essential (primary) hypertension: Secondary | ICD-10-CM | POA: Insufficient documentation

## 2014-05-26 DIAGNOSIS — K8050 Calculus of bile duct without cholangitis or cholecystitis without obstruction: Secondary | ICD-10-CM | POA: Insufficient documentation

## 2014-05-26 DIAGNOSIS — M1990 Unspecified osteoarthritis, unspecified site: Secondary | ICD-10-CM | POA: Diagnosis not present

## 2014-05-26 DIAGNOSIS — Z9049 Acquired absence of other specified parts of digestive tract: Secondary | ICD-10-CM | POA: Diagnosis not present

## 2014-05-26 HISTORY — DX: Malignant (primary) neoplasm, unspecified: C80.1

## 2014-05-26 HISTORY — DX: Dysphagia, unspecified: R13.10

## 2014-05-26 HISTORY — DX: Unspecified osteoarthritis, unspecified site: M19.90

## 2014-05-26 HISTORY — PX: EUS: SHX5427

## 2014-05-26 HISTORY — DX: Other specified postprocedural states: R11.2

## 2014-05-26 HISTORY — DX: Personal history of other medical treatment: Z92.89

## 2014-05-26 HISTORY — DX: Other specified postprocedural states: Z98.890

## 2014-05-26 SURGERY — ULTRASOUND, UPPER GI TRACT, ENDOSCOPIC
Anesthesia: Monitor Anesthesia Care

## 2014-05-26 MED ORDER — PROPOFOL 10 MG/ML IV BOLUS
INTRAVENOUS | Status: AC
Start: 2014-05-26 — End: 2014-05-26
  Filled 2014-05-26: qty 20

## 2014-05-26 MED ORDER — ONDANSETRON HCL 4 MG/2ML IJ SOLN
INTRAMUSCULAR | Status: DC | PRN
  Administered 2014-05-26: 4 mg via INTRAVENOUS

## 2014-05-26 MED ORDER — LACTATED RINGERS IV SOLN
INTRAVENOUS | Status: DC
  Administered 2014-05-26: 1000 mL via INTRAVENOUS

## 2014-05-26 MED ORDER — LIDOCAINE HCL (CARDIAC) 20 MG/ML IV SOLN
INTRAVENOUS | Status: AC
  Filled 2014-05-26: qty 5

## 2014-05-26 MED ORDER — PROPOFOL 10 MG/ML IV BOLUS
INTRAVENOUS | Status: DC | PRN
  Administered 2014-05-26: 20 mg via INTRAVENOUS

## 2014-05-26 MED ORDER — PROPOFOL 10 MG/ML IV BOLUS
INTRAVENOUS | Status: AC
  Filled 2014-05-26: qty 20

## 2014-05-26 MED ORDER — LIDOCAINE HCL (CARDIAC) 20 MG/ML IV SOLN
INTRAVENOUS | Status: DC | PRN
  Administered 2014-05-26: 100 mg via INTRAVENOUS

## 2014-05-26 MED ORDER — PROPOFOL INFUSION 10 MG/ML OPTIME
INTRAVENOUS | Status: DC | PRN
  Administered 2014-05-26: 140 ug/kg/min via INTRAVENOUS

## 2014-05-26 MED ORDER — ONDANSETRON HCL 4 MG/2ML IJ SOLN
INTRAMUSCULAR | Status: AC
  Filled 2014-05-26: qty 2

## 2014-05-26 MED ORDER — FENTANYL CITRATE 0.05 MG/ML IJ SOLN
INTRAMUSCULAR | Status: DC | PRN
  Administered 2014-05-26 (×2): 50 ug via INTRAVENOUS

## 2014-05-26 MED ORDER — FENTANYL CITRATE 0.05 MG/ML IJ SOLN
INTRAMUSCULAR | Status: AC
  Filled 2014-05-26: qty 2

## 2014-05-26 NOTE — Anesthesia Postprocedure Evaluation (Signed)
  Anesthesia Post-op Note  Patient: Jeffery Harvey  Procedure(s) Performed: Procedure(s): FULL UPPER ENDOSCOPIC ULTRASOUND (EUS) RADIAL (N/A)  Patient Location: PACU and Endoscopy Unit  Anesthesia Type:MAC  Level of Consciousness: awake  Airway and Oxygen Therapy: Patient Spontanous Breathing  Post-op Pain: mild  Post-op Assessment: Post-op Vital signs reviewed  Post-op Vital Signs: Reviewed  Last Vitals:  Filed Vitals:   05/26/14 0940  BP: 110/71  Pulse: 57  Temp:   Resp: 13    Complications: No apparent anesthesia complications

## 2014-05-26 NOTE — Transfer of Care (Signed)
Immediate Anesthesia Transfer of Care Note  Patient: Jeffery Harvey  Procedure(s) Performed: Procedure(s): FULL UPPER ENDOSCOPIC ULTRASOUND (EUS) RADIAL (N/A)  Patient Location: PACU and Endoscopy Unit  Anesthesia Type:MAC  Level of Consciousness: awake, alert  and oriented  Airway & Oxygen Therapy: Patient Spontanous Breathing and Patient connected to nasal cannula oxygen  Post-op Assessment: Report given to RN and Post -op Vital signs reviewed and stable  Post vital signs: Reviewed and stable  Last Vitals:  Filed Vitals:   05/26/14 0716  BP: 150/97  Pulse: 61  Temp: 36.4 C  Resp: 16    Complications: No apparent anesthesia complications

## 2014-05-26 NOTE — Anesthesia Preprocedure Evaluation (Addendum)
Anesthesia Evaluation  Patient identified by MRN, date of birth, ID band Patient awake    Reviewed: Allergy & Precautions, NPO status , Patient's Chart, lab work & pertinent test results  History of Anesthesia Complications (+) PONV  Airway Mallampati: II  TM Distance: >3 FB Neck ROM: Full    Dental   Pulmonary former smoker,          Cardiovascular hypertension,     Neuro/Psych    GI/Hepatic Neg liver ROS, GI history noted. CE   Endo/Other  negative endocrine ROS  Renal/GU negative Renal ROS     Musculoskeletal   Abdominal   Peds  Hematology   Anesthesia Other Findings   Reproductive/Obstetrics                           Anesthesia Physical Anesthesia Plan  ASA: II  Anesthesia Plan: MAC   Post-op Pain Management:    Induction: Intravenous  Airway Management Planned: Simple Face Mask  Additional Equipment:   Intra-op Plan:   Post-operative Plan:   Informed Consent:   Dental advisory given  Plan Discussed with: CRNA and Anesthesiologist  Anesthesia Plan Comments:         Anesthesia Quick Evaluation

## 2014-05-26 NOTE — Addendum Note (Signed)
Addendum  created 05/26/14 1305 by Sharlette Dense, CRNA   Modules edited: Anesthesia Attestations

## 2014-05-26 NOTE — H&P (Signed)
Patient interval history reviewed.  Patient examined again.  There has been no change from documented H/P dated 05/12/14 (scanned into chart from our office) except as documented above.  Assessment:  1.  Epigastric pain with elevated LFTs. 2.  Intermittent solid-predominant dysphagia.  Plan:  1.  Endoscopy with possible esophageal dilatation. 2.  Endoscopic ultrasound with possible fine needle aspiration biopsies. 3.  Risks (bleeding, infection, bowel perforation that could require surgery, sedation-related changes in cardiopulmonary systems), benefits (identification and possible treatment of source of symptoms, exclusion of certain causes of symptoms), and alternatives (watchful waiting, radiographic imaging studies, empiric medical treatment) of upper endoscopy with possible esophageal dilatation as well as upper endoscopic ultrasound with possible biopsies (EGD +/- DIL; EUS +/- FNA) were explained to patient/family in detail and patient wishes to proceed.

## 2014-05-26 NOTE — Op Note (Signed)
[facility] [place], [address]   UPPER ENDOSCOPIC ULTRASOUND PROCEDURE REPORT     EXAM DATE: 05/26/2014  PATIENT NAME:          Jeffery Harvey, Jeffery Harvey          MR#: [id number] BIRTHDATE:       [birth year]     VISIT #:     [id number] ATTENDING:     Arta Silence, MD     STATUS:     outpatient ASSISTANT:      Hilma Favors and Lindaann Slough MD:  Mali Badger, M.D. ASA CLASS:        Class II INDICATIONS:  The patient is a 69 yr old male here for a lower endoscopic ultrasound due to epigastric abdominal pain, elevated LFTs. PROCEDURE PERFORMED:     Upper endoscopic ultrasound (EUS)   MEDICATIONS:     Monitored anesthesia care  CONSENT: The patient understands the risks and benefits of the procedure and understands that these risks include, but are not limited to: sedation, allergic reaction, infection, perforation and/or bleeding. Alternative means of evaluation and treatment include, among others: physical exam, x-rays, and/or surgical intervention. The patient elects to proceed with this endoscopic procedure.  DESCRIPTION OF PROCEDURE: Informed consent was verified, confirmed and timeout was successfully executed by the treatment team. The patient was then placed in the left, lateral, decubitus position and IV sedation was administered. Throughout the procedure, the patients blood pressure, pulse and oxygen saturations were monitored continuously. Under direct visualization, the radial echoendoscope      was introduced through the mouth and advanced to the   .  Water was used as necessary to provide an acoustic interface. The pulse, BP, and O2 saturation were monitored and documented by the physician and nursing staff throughout the procedure. Upon completion of the imaging, water was removed and the patient was then discharged to recovery in stable condition with the appropriate post procedure care.   Bile duct prominent,  68mm.   There was a 65mm shadowing filling defect in the distal bile duct.  Some sludge noted in bile duct as well.  Post cholecystectomy.  No obvious pancreatic mass was seen in the head/uncinate pancreas.  Esophagus appeared a bit tortuous but otherwise normal.     ADVERSE EVENTS:     None IMPRESSIONS:     CBD stone.  RECOMMENDATIONS:     1.  Watch for potential complications of procedure. 2.  Set up expedited outpatient ERCP, sooner (as inpatient) should patient have escalating abdominal pain or signs of cholangitis (which he currently does not and his most recent LFTs were normal). 3.  Will discuss with Dr. Cristina Gong.  ___________________________________ Arta Silence, MD eSigned:  Arta Silence, MD 05/26/2014 9:30 AM   cc:

## 2014-05-27 ENCOUNTER — Encounter (HOSPITAL_COMMUNITY): Payer: Self-pay | Admitting: Gastroenterology

## 2014-06-01 ENCOUNTER — Other Ambulatory Visit: Payer: Self-pay | Admitting: Gastroenterology

## 2014-06-01 NOTE — Addendum Note (Signed)
Addended by: Calogero Geisen on: 06/01/2014 01:20 PM   Modules accepted: Orders  

## 2014-06-02 ENCOUNTER — Encounter (HOSPITAL_COMMUNITY): Payer: Self-pay | Admitting: *Deleted

## 2014-06-02 ENCOUNTER — Encounter (HOSPITAL_COMMUNITY)
Admission: RE | Disposition: A | Discharge status: Home / Self Care | Payer: Self-pay | Source: Ambulatory Visit | Attending: Gastroenterology

## 2014-06-02 ENCOUNTER — Ambulatory Visit (HOSPITAL_COMMUNITY): Payer: PPO | Admitting: Registered Nurse

## 2014-06-02 ENCOUNTER — Ambulatory Visit (HOSPITAL_COMMUNITY): Payer: PPO

## 2014-06-02 ENCOUNTER — Ambulatory Visit (HOSPITAL_COMMUNITY)
Admission: RE | Admit: 2014-06-02 | Discharge: 2014-06-02 | Disposition: A | Discharge status: Home / Self Care | Payer: PPO | Source: Ambulatory Visit | Attending: Gastroenterology | Admitting: Gastroenterology

## 2014-06-02 DIAGNOSIS — K8050 Calculus of bile duct without cholangitis or cholecystitis without obstruction: Secondary | ICD-10-CM | POA: Diagnosis not present

## 2014-06-02 DIAGNOSIS — K8020 Calculus of gallbladder without cholecystitis without obstruction: Secondary | ICD-10-CM

## 2014-06-02 DIAGNOSIS — I10 Essential (primary) hypertension: Secondary | ICD-10-CM | POA: Diagnosis not present

## 2014-06-02 HISTORY — PX: ERCP: SHX5425

## 2014-06-02 SURGERY — ERCP, WITH INTERVENTION IF INDICATED
Anesthesia: General

## 2014-06-02 MED ORDER — PROPOFOL 10 MG/ML IV BOLUS
INTRAVENOUS | Status: AC
  Filled 2014-06-02: qty 20

## 2014-06-02 MED ORDER — SODIUM CHLORIDE 0.9 % IV SOLN: INTRAVENOUS | Status: DC

## 2014-06-02 MED ORDER — GLUCAGON HCL RDNA (DIAGNOSTIC) 1 MG IJ SOLR
INTRAMUSCULAR | Status: DC | PRN
  Administered 2014-06-02 (×2): .5 mg via INTRAVENOUS

## 2014-06-02 MED ORDER — GLUCAGON HCL RDNA (DIAGNOSTIC) 1 MG IJ SOLR
INTRAMUSCULAR | Status: AC
  Filled 2014-06-02: qty 1

## 2014-06-02 MED ORDER — PROPOFOL 10 MG/ML IV BOLUS
INTRAVENOUS | Status: DC | PRN
  Administered 2014-06-02: 200 mg via INTRAVENOUS

## 2014-06-02 MED ORDER — FENTANYL CITRATE 0.05 MG/ML IJ SOLN
INTRAMUSCULAR | Status: DC | PRN
  Administered 2014-06-02: 100 ug via INTRAVENOUS

## 2014-06-02 MED ORDER — NEOSTIGMINE METHYLSULFATE 10 MG/10ML IV SOLN
INTRAVENOUS | Status: AC
  Filled 2014-06-02: qty 2

## 2014-06-02 MED ORDER — SUCCINYLCHOLINE CHLORIDE 20 MG/ML IJ SOLN
INTRAMUSCULAR | Status: DC | PRN
  Administered 2014-06-02: 100 mg via INTRAVENOUS

## 2014-06-02 MED ORDER — ONDANSETRON HCL 4 MG/2ML IJ SOLN
INTRAMUSCULAR | Status: DC | PRN
Start: 2014-06-02 — End: 2014-06-02
  Administered 2014-06-02: 4 mg via INTRAVENOUS

## 2014-06-02 MED ORDER — FENTANYL CITRATE 0.05 MG/ML IJ SOLN
INTRAMUSCULAR | Status: AC
  Filled 2014-06-02: qty 2

## 2014-06-02 MED ORDER — LIDOCAINE HCL (PF) 2 % IJ SOLN
INTRAMUSCULAR | Status: DC | PRN
  Administered 2014-06-02: 75 mg via INTRADERMAL

## 2014-06-02 MED ORDER — DEXAMETHASONE SODIUM PHOSPHATE 10 MG/ML IJ SOLN
INTRAMUSCULAR | Status: AC
  Filled 2014-06-02: qty 1

## 2014-06-02 MED ORDER — ONDANSETRON HCL 4 MG/2ML IJ SOLN
INTRAMUSCULAR | Status: AC
  Filled 2014-06-02: qty 2

## 2014-06-02 MED ORDER — FENTANYL CITRATE 0.05 MG/ML IJ SOLN: 25.0000 ug | INTRAMUSCULAR | Status: DC | PRN

## 2014-06-02 MED ORDER — LACTATED RINGERS IV SOLN
INTRAVENOUS | Status: DC
  Administered 2014-06-02: 1000 mL via INTRAVENOUS

## 2014-06-02 MED ORDER — CIPROFLOXACIN IN D5W 400 MG/200ML IV SOLN
INTRAVENOUS | Status: AC
  Filled 2014-06-02: qty 200

## 2014-06-02 MED ORDER — EPHEDRINE SULFATE 50 MG/ML IJ SOLN
INTRAMUSCULAR | Status: DC | PRN
  Administered 2014-06-02 (×2): 5 mg via INTRAVENOUS

## 2014-06-02 MED ORDER — GLYCOPYRROLATE 0.2 MG/ML IJ SOLN
INTRAMUSCULAR | Status: AC
Start: 2014-06-02 — End: 2014-06-02
  Filled 2014-06-02: qty 6

## 2014-06-02 MED ORDER — CIPROFLOXACIN IN D5W 400 MG/200ML IV SOLN
400.0000 mg | Freq: Once | INTRAVENOUS | Status: AC
  Administered 2014-06-02: 400 mg via INTRAVENOUS

## 2014-06-02 MED ORDER — DEXAMETHASONE SODIUM PHOSPHATE 10 MG/ML IJ SOLN
INTRAMUSCULAR | Status: DC | PRN
  Administered 2014-06-02: 10 mg via INTRAVENOUS

## 2014-06-02 MED ORDER — LIDOCAINE HCL (CARDIAC) 20 MG/ML IV SOLN
INTRAVENOUS | Status: AC
  Filled 2014-06-02: qty 5

## 2014-06-02 NOTE — Op Note (Signed)
[facility] [place], [address]   ERCP PROCEDURE REPORT        EXAM DATE: 06/02/2014  PATIENT NAME:          Jeffery Harvey, Jeffery Harvey          MR #: [id number] BIRTHDATE:       07/23/1945     VISIT #:     [id number] ATTENDING:     Arta Silence, MD     STATUS:     outpatient REFERRING MD:       Ronald Lobo, M.D.  INDICATIONS:  The patient is a 69 yr old male here for an ERCP due to bile duct stone (noted on endoscopic ultrasound last week); recent history of abdominal pain and elevated LFTs (which have subsequently normalized).Marland Kitchen PROCEDURE PERFORMED:     ERCP with sphincterotomy/papillotomy ERCP with stent placement MEDICATIONS:     General endotracheal anesthesia; ciprofloxacin 400 mg IV; glucagon 1.5 mg IV  CONSENT: The patient understands the risks and benefits of the procedure and understands that these risks include, but are not limited to: sedation, allergic reaction, infection, perforation and/or bleeding. Alternative means of evaluation and treatment include, among others: physical exam, x-rays, and/or surgical intervention. The patient elects to proceed with this endoscopic procedure.  DESCRIPTION OF PROCEDURE: During intra-op preparation period all mechanical & medical equipment was checked for proper function. Hand hygiene and appropriate measures for infection prevention was taken. After the risks, benefits and alternatives of the procedure were thoroughly explained, Informed was verified, confirmed and timeout was successfully executed by the treatment team. With the patient in left semi-prone position, medications were administered intravenously.The duodenoscope      was passed from the mouth into the esophagus and further advanced from the esophagus into the stomach. From stomach scope was directed to the second portion of the duodenum     .  Major papilla was aligned with the duodenoscope. The scope position was confirmed  fluoroscopically. Pancreatic duct was repeated cannulated, ultimately necessitating placement of 5Fr, 5cm pigtail pancreatic stent.  Despite noting copious bile through the ampulla, I was unable to cannulate CBD over the stent, even after performing 47mm needle knife papillotomy over the stent.  Multiple different types of sphincterotomes and multiple different types and sizes of guidewires were tried, unfortunately without success.      ADVERSE EVENT: IMPRESSIONS:     As above.  Pancreatic stent placement.  Needle knife papillotomy.  Unable to cannulate CBD.  RECOMMENDATIONS:     1.  Watch for potential complications of procedure. 2.  Repeat EUS in a couple weeks, to see if CBD stone (seen last week) is still present; and, if so, would retry ERCP, with on-site availability of another ERCP endoscopist in case cannulation again proves difficult. 3.  Avoid ASA/NSAIDs for the next 5 days, as clinically feasible.  REPEAT EXAM:   ___________________________________ Arta Silence, MD eSigned:  Arta Silence, MD 06/02/2014 12:22 PM   cc:

## 2014-06-02 NOTE — Anesthesia Procedure Notes (Signed)
Procedure Name: Intubation Date/Time: 06/02/2014 10:53 AM Performed by: Lollie Sails Pre-anesthesia Checklist: Patient identified, Emergency Drugs available, Suction available, Patient being monitored and Timeout performed Patient Re-evaluated:Patient Re-evaluated prior to inductionOxygen Delivery Method: Circle system utilized Preoxygenation: Pre-oxygenation with 100% oxygen Intubation Type: IV induction Ventilation: Mask ventilation without difficulty Laryngoscope Size: Mac and 4 Grade View: Grade I Tube type: Oral Tube size: 7.5 mm Number of attempts: 1 Airway Equipment and Method: Stylet Placement Confirmation: ETT inserted through vocal cords under direct vision,  positive ETCO2 and breath sounds checked- equal and bilateral Secured at: 22 cm Tube secured with: Tape Dental Injury: Teeth and Oropharynx as per pre-operative assessment  Comments: Good view with good neck extension.

## 2014-06-02 NOTE — Discharge Instructions (Signed)
Endoscopic Retrograde Cholangiopancreatography (ERCP), Care After °Refer to this sheet in the next few weeks. These instructions provide you with information on caring for yourself after your procedure. Your health care provider may also give you more specific instructions. Your treatment has been planned according to current medical practices, but problems sometimes occur. Call your health care provider if you have any problems or questions after your procedure.  °WHAT TO EXPECT AFTER THE PROCEDURE  °After your procedure, it is typical to feel:  °· Soreness in your throat.   °· Sick to your stomach (nauseous).   °· Bloated. °· Dizzy.   °· Fatigued. °HOME CARE INSTRUCTIONS °· Have a friend or family member stay with you for the first 24 hours after your procedure. °· Start taking your usual medicines and eating normally as soon as you feel well enough to do so or as directed by your health care provider. °SEEK MEDICAL CARE IF: °· You have abdominal pain.   °· You develop signs of infection, such as:   °¨ Chills.   °¨ Feeling unwell.   °SEEK IMMEDIATE MEDICAL CARE IF: °· You have difficulty swallowing. °· You have worsening throat, chest, or abdominal pain. °· You vomit. °· You have bloody or very black stools. °· You have a fever. °Document Released: 11/26/2012 Document Reviewed: 11/26/2012 °[employer]® Patient Information ©2015 [employer]. This information is not intended to replace advice given to you by your health care provider. Make sure you discuss any questions you have with your health care provider. ° °

## 2014-06-02 NOTE — H&P (Signed)
Patient interval history reviewed.  Patient examined again.  There has been no change from documented H/P dated 05/12/14 (scanned into chart from our office) except as documented above.  Assessment:  1.  Elevated LFTs, normalized. 2.  Abdominal pain, much improved. 3.  Suspected bile duct stone on last week's endoscopic ultrasound.  Plan:  1.  Endoscopic retrograde cholangiopancreatography with biliary sphincterotomy and possible bile duct stone extraction. 2.  Risks (up to and including bleeding, infection, perforation, pancreatitis that can be complicated by infected necrosis and death), benefits (removal of stones, alleviating blockage, decreasing risk of cholangitis or choledocholithiasis-related pancreatitis), and alternatives (watchful waiting, percutaneous transhepatic cholangiography) of ERCP were explained to patient/family in detail and patient elects to proceed.

## 2014-06-02 NOTE — Anesthesia Postprocedure Evaluation (Signed)
  Anesthesia Post-op Note  Patient: Jeffery Harvey  Procedure(s) Performed: Procedure(s) (LRB): ENDOSCOPIC RETROGRADE CHOLANGIOPANCREATOGRAPHY (ERCP) (N/A)  Patient Location: PACU  Anesthesia Type: General  Level of Consciousness: awake and alert   Airway and Oxygen Therapy: Patient Spontanous Breathing  Post-op Pain: mild  Post-op Assessment: Post-op Vital signs reviewed, Patient's Cardiovascular Status Stable, Respiratory Function Stable, Patent Airway and No signs of Nausea or vomiting  Last Vitals:  Filed Vitals:   06/02/14 1310  BP: 125/65  Pulse: 68  Temp:   Resp: 16    Post-op Vital Signs: stable   Complications: No apparent anesthesia complications

## 2014-06-02 NOTE — Transfer of Care (Signed)
Immediate Anesthesia Transfer of Care Note  Patient: Jeffery Harvey  Procedure(s) Performed: Procedure(s): ENDOSCOPIC RETROGRADE CHOLANGIOPANCREATOGRAPHY (ERCP) (N/A)  Patient Location: PACU and Endoscopy Unit  Anesthesia Type:General  Level of Consciousness: awake, alert , oriented and patient cooperative  Airway & Oxygen Therapy: Patient Spontanous Breathing and Patient connected to face mask oxygen  Post-op Assessment: Report given to RN, Post -op Vital signs reviewed and stable and Patient moving all extremities  Post vital signs: Reviewed and stable  Last Vitals:  Filed Vitals:   06/02/14 0859  BP: 140/97  Pulse: 56  Temp: 36.5 C  Resp: 18    Complications: No apparent anesthesia complications

## 2014-06-02 NOTE — Anesthesia Preprocedure Evaluation (Addendum)
Anesthesia Evaluation  Patient identified by MRN, date of birth, ID band Patient awake    Reviewed: Allergy & Precautions, NPO status , Patient's Chart, lab work & pertinent test results  History of Anesthesia Complications (+) PONV  Airway Mallampati: I  TM Distance: >3 FB Neck ROM: Full    Dental   Pulmonary  breath sounds clear to auscultation        Cardiovascular hypertension, Pt. on medications Rhythm:Regular     Neuro/Psych    GI/Hepatic   Endo/Other    Renal/GU      Musculoskeletal   Abdominal   Peds  Hematology   Anesthesia Other Findings   Reproductive/Obstetrics                            Anesthesia Physical Anesthesia Plan  ASA: II  Anesthesia Plan: General   Post-op Pain Management:    Induction: Intravenous  Airway Management Planned: Oral ETT  Additional Equipment:   Intra-op Plan:   Post-operative Plan: Extubation in OR  Informed Consent: I have reviewed the patients History and Physical, chart, labs and discussed the procedure including the risks, benefits and alternatives for the proposed anesthesia with the patient or authorized representative who has indicated his/her understanding and acceptance.     Plan Discussed with: CRNA and Surgeon  Anesthesia Plan Comments:         Anesthesia Quick Evaluation

## 2014-06-03 ENCOUNTER — Encounter (HOSPITAL_COMMUNITY): Payer: Self-pay | Admitting: Gastroenterology

## 2014-06-22 ENCOUNTER — Encounter (HOSPITAL_COMMUNITY): Payer: Self-pay | Admitting: *Deleted

## 2014-06-28 ENCOUNTER — Other Ambulatory Visit: Payer: Self-pay | Admitting: Gastroenterology

## 2014-06-29 ENCOUNTER — Ambulatory Visit (HOSPITAL_COMMUNITY)
Admission: RE | Admit: 2014-06-29 | Discharge: 2014-06-29 | Disposition: A | Discharge status: Home / Self Care | Payer: PPO | Source: Ambulatory Visit | Attending: Gastroenterology | Admitting: Gastroenterology

## 2014-06-29 ENCOUNTER — Encounter (HOSPITAL_COMMUNITY)
Admission: RE | Disposition: A | Discharge status: Home / Self Care | Payer: Self-pay | Source: Ambulatory Visit | Attending: Gastroenterology

## 2014-06-29 ENCOUNTER — Encounter (HOSPITAL_COMMUNITY): Payer: Self-pay | Admitting: Anesthesiology

## 2014-06-29 ENCOUNTER — Ambulatory Visit (HOSPITAL_COMMUNITY): Payer: PPO | Admitting: Anesthesiology

## 2014-06-29 ENCOUNTER — Ambulatory Visit (HOSPITAL_COMMUNITY): Payer: PPO

## 2014-06-29 DIAGNOSIS — K8050 Calculus of bile duct without cholangitis or cholecystitis without obstruction: Secondary | ICD-10-CM

## 2014-06-29 DIAGNOSIS — Z9049 Acquired absence of other specified parts of digestive tract: Secondary | ICD-10-CM | POA: Diagnosis not present

## 2014-06-29 DIAGNOSIS — Z981 Arthrodesis status: Secondary | ICD-10-CM | POA: Diagnosis not present

## 2014-06-29 DIAGNOSIS — Z880 Allergy status to penicillin: Secondary | ICD-10-CM | POA: Diagnosis not present

## 2014-06-29 DIAGNOSIS — I10 Essential (primary) hypertension: Secondary | ICD-10-CM | POA: Insufficient documentation

## 2014-06-29 DIAGNOSIS — M1990 Unspecified osteoarthritis, unspecified site: Secondary | ICD-10-CM | POA: Insufficient documentation

## 2014-06-29 DIAGNOSIS — Z881 Allergy status to other antibiotic agents status: Secondary | ICD-10-CM | POA: Insufficient documentation

## 2014-06-29 DIAGNOSIS — Z888 Allergy status to other drugs, medicaments and biological substances status: Secondary | ICD-10-CM | POA: Diagnosis not present

## 2014-06-29 HISTORY — PX: EUS: SHX5427

## 2014-06-29 HISTORY — PX: ERCP: SHX5425

## 2014-06-29 SURGERY — UPPER ENDOSCOPIC ULTRASOUND (EUS) RADIAL
Anesthesia: Monitor Anesthesia Care

## 2014-06-29 MED ORDER — HYDROMORPHONE HCL 1 MG/ML IJ SOLN: 0.2500 mg | INTRAMUSCULAR | Status: DC | PRN

## 2014-06-29 MED ORDER — SODIUM CHLORIDE 0.9 % IV SOLN: INTRAVENOUS | Status: DC

## 2014-06-29 MED ORDER — PROPOFOL 500 MG/50ML IV EMUL
INTRAVENOUS | Status: DC | PRN
  Administered 2014-06-29: 80 mg via INTRAVENOUS
  Administered 2014-06-29: 50 mg via INTRAVENOUS
  Administered 2014-06-29: 20 mg via INTRAVENOUS

## 2014-06-29 MED ORDER — EPHEDRINE SULFATE 50 MG/ML IJ SOLN
INTRAMUSCULAR | Status: AC
  Filled 2014-06-29: qty 1

## 2014-06-29 MED ORDER — FENTANYL CITRATE (PF) 100 MCG/2ML IJ SOLN
INTRAMUSCULAR | Status: DC | PRN
  Administered 2014-06-29: 50 ug via INTRAVENOUS

## 2014-06-29 MED ORDER — MEPERIDINE HCL 100 MG/ML IJ SOLN
6.2500 mg | INTRAMUSCULAR | Status: DC | PRN
Start: 2014-06-29 — End: 2014-07-01

## 2014-06-29 MED ORDER — ONDANSETRON HCL 4 MG/2ML IJ SOLN
INTRAMUSCULAR | Status: DC | PRN
  Administered 2014-06-29: 4 mg via INTRAVENOUS

## 2014-06-29 MED ORDER — CIPROFLOXACIN IN D5W 400 MG/200ML IV SOLN
INTRAVENOUS | Status: AC
Start: 2014-06-29 — End: 2014-06-29
  Filled 2014-06-29: qty 200

## 2014-06-29 MED ORDER — NAPROXEN SODIUM 220 MG PO TABS
220.0000 mg | ORAL_TABLET | Freq: Two times a day (BID) | ORAL | Status: DC
Start: 2014-07-04 — End: 2015-09-28

## 2014-06-29 MED ORDER — ONDANSETRON HCL 4 MG/2ML IJ SOLN: 4.0000 mg | Freq: Once | INTRAMUSCULAR | Status: AC | PRN

## 2014-06-29 MED ORDER — EPHEDRINE SULFATE 50 MG/ML IJ SOLN
INTRAMUSCULAR | Status: DC | PRN
  Administered 2014-06-29 (×2): 5 mg via INTRAVENOUS

## 2014-06-29 MED ORDER — GLUCAGON HCL RDNA (DIAGNOSTIC) 1 MG IJ SOLR
INTRAMUSCULAR | Status: AC
  Filled 2014-06-29: qty 1

## 2014-06-29 MED ORDER — GLUCAGON HCL RDNA (DIAGNOSTIC) 1 MG IJ SOLR
INTRAMUSCULAR | Status: DC | PRN
  Administered 2014-06-29 (×2): .5 mg via INTRAVENOUS

## 2014-06-29 MED ORDER — PROPOFOL 10 MG/ML IV BOLUS
INTRAVENOUS | Status: AC
  Filled 2014-06-29: qty 20

## 2014-06-29 MED ORDER — SODIUM CHLORIDE 0.9 % IJ SOLN
INTRAMUSCULAR | Status: AC
  Filled 2014-06-29: qty 10

## 2014-06-29 MED ORDER — IOHEXOL 350 MG/ML SOLN
INTRAVENOUS | Status: DC | PRN
  Administered 2014-06-29: 14:00:00

## 2014-06-29 MED ORDER — FENTANYL CITRATE (PF) 100 MCG/2ML IJ SOLN
INTRAMUSCULAR | Status: AC
  Filled 2014-06-29: qty 2

## 2014-06-29 MED ORDER — CIPROFLOXACIN IN D5W 400 MG/200ML IV SOLN
400.0000 mg | Freq: Once | INTRAVENOUS | Status: AC
  Administered 2014-06-29: 400 mg via INTRAVENOUS

## 2014-06-29 MED ORDER — ONDANSETRON HCL 4 MG/2ML IJ SOLN
INTRAMUSCULAR | Status: AC
Start: 2014-06-29 — End: 2014-06-29
  Filled 2014-06-29: qty 2

## 2014-06-29 MED ORDER — SUCCINYLCHOLINE CHLORIDE 20 MG/ML IJ SOLN
INTRAMUSCULAR | Status: DC | PRN
  Administered 2014-06-29: 100 mg via INTRAVENOUS

## 2014-06-29 MED ORDER — PROPOFOL INFUSION 10 MG/ML OPTIME
INTRAVENOUS | Status: DC | PRN
  Administered 2014-06-29: 100 ug/kg/min via INTRAVENOUS

## 2014-06-29 MED ORDER — LIDOCAINE HCL (CARDIAC) 20 MG/ML IV SOLN
INTRAVENOUS | Status: AC
  Filled 2014-06-29: qty 5

## 2014-06-29 MED ORDER — LACTATED RINGERS IV SOLN
INTRAVENOUS | Status: DC
Start: 2014-06-29 — End: 2014-07-01
  Administered 2014-06-29 (×2): via INTRAVENOUS

## 2014-06-29 NOTE — Anesthesia Procedure Notes (Signed)
Procedure Name: Intubation Date/Time: 06/29/2014 12:02 PM Performed by: Glory Buff Pre-anesthesia Checklist: Patient identified, Emergency Drugs available, Suction available and Patient being monitored Patient Re-evaluated:Patient Re-evaluated prior to inductionOxygen Delivery Method: Circle System Utilized Preoxygenation: Pre-oxygenation with 100% oxygen Intubation Type: IV induction Ventilation: Mask ventilation without difficulty Laryngoscope Size: Miller and 3 Grade View: Grade I Tube type: Oral Tube size: 7.5 mm Number of attempts: 1 Airway Equipment and Method: Stylet and Oral airway Placement Confirmation: ETT inserted through vocal cords under direct vision,  positive ETCO2 and breath sounds checked- equal and bilateral Secured at: 22 cm Tube secured with: Tape Dental Injury: Teeth and Oropharynx as per pre-operative assessment

## 2014-06-29 NOTE — Op Note (Signed)
[place], [address]   ENDOSCOPIC ULTRASOUND PROCEDURE REPORT  PATIENT: Jeffery, Harvey  MR#: [id number] BIRTHDATE: 01/21/1946  GENDER: male ENDOSCOPIST: Arta Silence, MD REFERRED BY:  Ronald Lobo, M.D.  Lavone Orn, M.D. PROCEDURE DATE:  06/29/2014 PROCEDURE:   Upper EUS ASA CLASS:      Class II INDICATIONS:   1.  abdominal pain, elevated LFTs. MEDICATIONS: Monitored anesthesia care  DESCRIPTION OF PROCEDURE:   After the risks benefits and alternatives of the procedure were  explained, informed consent was obtained. The patient was then placed in the left, lateral, decubitus postion and IV sedation was administered. Throughout the procedure, the patients blood pressure, pulse and oxygen saturations were monitored continuously.  Under direct visualization, the radial forward-viewing  echoendoscope was introduced through the mouth  and advanced to the second portion of the duodenum .  Water was used as necessary to provide an acoustic interface. Estimated blood loss is zero unless otherwise noted in this procedure report. Upon completion of the imaging, water was removed and the patient was sent to the recovery room in satisfactory condition.    FINDINGS:      Non-dilated bile duct.  Couple small distal bile duct filling defects noted.  Previously placed pancreatic duct stent had spontaneously migrated  IMPRESSION:     Persistent bile duct stone.  RECOMMENDATIONS:     1.  Watch for potential complications of procedure. 2.  Proceed with ERCP today.   _______________________________ Lorrin MaisArta Silence, MD 06/29/2014 1:55 PM   CC:

## 2014-06-29 NOTE — Anesthesia Preprocedure Evaluation (Addendum)
Anesthesia Evaluation  Patient identified by MRN, date of birth, ID band Patient awake    Reviewed: Allergy & Precautions, NPO status , Patient's Chart, lab work & pertinent test results  History of Anesthesia Complications (+) PONV and history of anesthetic complications  Airway Mallampati: II  TM Distance: >3 FB Neck ROM: Full    Dental no notable dental hx.    Pulmonary neg pulmonary ROS,  breath sounds clear to auscultation  Pulmonary exam normal       Cardiovascular Exercise Tolerance: Good hypertension, Pt. on medications and Pt. on home beta blockers negative cardio ROS Normal cardiovascular examRhythm:Regular Rate:Normal     Neuro/Psych negative neurological ROS  negative psych ROS   GI/Hepatic negative GI ROS, Neg liver ROS,   Endo/Other  negative endocrine ROS  Renal/GU negative Renal ROS  negative genitourinary   Musculoskeletal  (+) Arthritis -,   Abdominal   Peds negative pediatric ROS (+)  Hematology negative hematology ROS (+)   Anesthesia Other Findings   Reproductive/Obstetrics negative OB ROS                            Anesthesia Physical Anesthesia Plan  ASA: II  Anesthesia Plan: MAC   Post-op Pain Management:    Induction: Intravenous  Airway Management Planned: Natural Airway  Additional Equipment:   Intra-op Plan:   Post-operative Plan: Extubation in OR  Informed Consent: I have reviewed the patients History and Physical, chart, labs and discussed the procedure including the risks, benefits and alternatives for the proposed anesthesia with the patient or authorized representative who has indicated his/her understanding and acceptance.   Dental advisory given  Plan Discussed with: CRNA  Anesthesia Plan Comments: (After discussion with Dr. Paulita Fujita, plan MAC with GA backup as ERCP may not need to be done.)        Anesthesia Quick Evaluation

## 2014-06-29 NOTE — H&P (Signed)
Patient interval history reviewed.  Patient examined again.  There has been no change from documented H/P dated 06/17/14 (scanned into chart from our office) except as documented above.  Assessment:  1.  Intermittent abdominal pain.  Resolved, query possible bile duct stone passage. 2.  Bile duct stone, failed prior ERCP despite pancreatic duct stent placement and needle knife papillotomy.  Plan:  1.  Endoscopic ultrasound to assess for residual bile duct stone. 2.  Risks (bleeding, infection, bowel perforation that could require surgery, sedation-related changes in cardiopulmonary systems), benefits (identification and possible treatment of source of symptoms, exclusion of certain causes of symptoms), and alternatives (watchful waiting, radiographic imaging studies, empiric medical treatment) of upper endoscopy with ultrasound (EUS) were explained to patient/family in detail and patient wishes to proceed. 3.  If bile duct stone is seen, will retry ERCP for possible bile duct stone extraction. 4.  Risks (up to and including bleeding, infection, perforation, pancreatitis that can be complicated by infected necrosis and death), benefits (removal of stones, alleviating blockage, decreasing risk of cholangitis or choledocholithiasis-related pancreatitis), and alternatives (watchful waiting, percutaneous transhepatic cholangiography) of ERCP were explained to patient/family in detail and patient elects to proceed. 5.  If pancreatic duct stent is still in place and ERCP is not needed, will remove pancreatic duct stent.

## 2014-06-29 NOTE — Op Note (Signed)
[facility] [place], [address]   ERCP PROCEDURE REPORT        EXAM DATE: 06/29/2014  PATIENT NAME:          Jeffery Harvey, Jeffery Harvey          MR #: [id number] BIRTHDATE:       1945/04/07     VISIT #:     [phone] ATTENDING:     Arta Silence, MD     STATUS:     outpatient REFERRING MD:       Lavone Orn, M.D.  Ronald Lobo, M.D.   INDICATIONS:  The patient is a 69 yr old male here for an ERCP due to bile duct stone (seen on today's EUS). PROCEDURE PERFORMED:     ERCP with sphincterotomy/papillotomy ERCP with stent placement ERCP with removal of calculus/calculi MEDICATIONS:     General endotracheal anesthesia; ciprofloxacin 400 mg IV; glucagon 1 mg IV  CONSENT: The patient understands the risks and benefits of the procedure and understands that these risks include, but are not limited to: sedation, allergic reaction, infection, perforation and/or bleeding. Alternative means of evaluation and treatment include, among others: physical exam, x-rays, and/or surgical intervention. The patient elects to proceed with this endoscopic procedure.  DESCRIPTION OF PROCEDURE: Informed was verified, confirmed and timeout was successfully executed by the treatment team. With the patient in left semi-prone position, medications were administered intravenously.The duodenoscope was passed from the mouth into the esophagus and further advanced from the esophagus into the stomach. From stomach scope was directed to the second portion of the duodenum     . Major papilla was aligned with the duodenoscope.  Previously placed pancreatic duct stent had spontaneously migrated.  Ampulla otherwise normal.  Pancreatic duct continuously again preferentially cannulated, thus another 5-Fr 5-cm pigtail PD stent was placed.  With time, bile duct was cannulated over the pancreatic duct stent.  Distal filling defects confirmed, but otherwise normal cholangiogram.   Moderate biliary sphincterotomy was performed with blended current without immediate complication. Basket used to remove two small triangular black stones.  Occlusion completion cholangiogram was normal.      ADVERSE EVENT:     None IMPRESSIONS:     Bile duct stones, removed.  Pancreatic duct stent replaced.  RECOMMENDATIONS:     1.  Watch for potential complications of procedure. 2.  No ASA/NSAIDs for 5 days, post-sphincterotomy. 3.  Follow-up with [facility] in 2-3 weeks. 4.  Abdominal xray in 3-4 weeks to ensure passage of pancreatic duct stent.  REPEAT EXAM:     None anticipated.   ___________________________________ Arta Silence, MD eSigned:  Arta Silence, MD 06/29/2014 2:10 PM   cc:

## 2014-06-29 NOTE — Transfer of Care (Signed)
Immediate Anesthesia Transfer of Care Note  Patient: Jeffery Harvey  Procedure(s) Performed: Procedure(s): UPPER ENDOSCOPIC ULTRASOUND (EUS) RADIAL (N/A) ENDOSCOPIC RETROGRADE CHOLANGIOPANCREATOGRAPHY (ERCP) (N/A)  Patient Location: PACU  Anesthesia Type:General  Level of Consciousness: awake, alert  and oriented  Airway & Oxygen Therapy: Patient Spontanous Breathing and Patient connected to face mask oxygen  Post-op Assessment: Report given to RN and Post -op Vital signs reviewed and stable  Post vital signs: Reviewed and stable  Last Vitals:  Filed Vitals:   06/29/14 1057  BP: 144/87  Pulse: 57  Temp: 36.7 C  Resp: 18    Complications: No apparent anesthesia complications

## 2014-06-29 NOTE — Anesthesia Postprocedure Evaluation (Signed)
  Anesthesia Post-op Note  Patient: Jeffery Harvey  Procedure(s) Performed: Procedure(s) (LRB): UPPER ENDOSCOPIC ULTRASOUND (EUS) RADIAL (N/A) ENDOSCOPIC RETROGRADE CHOLANGIOPANCREATOGRAPHY (ERCP) (N/A)  Patient Location: PACU  Anesthesia Type: general  Level of Consciousness: awake and alert   Airway and Oxygen Therapy: Patient Spontanous Breathing  Post-op Pain: mild  Post-op Assessment: Post-op Vital signs reviewed, Patient's Cardiovascular Status Stable, Respiratory Function Stable, Patent Airway and No signs of Nausea or vomiting  Last Vitals:  Filed Vitals:   06/29/14 1500  BP: 143/85  Pulse: 62  Temp:   Resp: 15    Post-op Vital Signs: stable   Complications: No apparent anesthesia complications

## 2014-06-29 NOTE — Discharge Instructions (Signed)
Endoscopic Retrograde Cholangiopancreatography (ERCP) °ERCP stands for endoscopic retrograde cholangiopancreatography. In this procedure, a thin, lighted tube (endoscope) is used. It is passed through the mouth and down the back of the throat into the upper part of the intestine, called the duodenum. A small, plastic tube (cannula) is then passed through the endoscope. It is directed into the bile duct or pancreatic duct. Dye is then injected through the tube. X-rays are taken to study the biliary and pancreatic passageways. This procedure is used to diagnosis many diseases of the pancreas, bile ducts, liver, and gallbladder. °LET YOUR CAREGIVER KNOW ABOUT:  °· Allergies to food or medicine.  °· Medicines taken, including vitamins, herbs, eyedrops, over-the-counter medicines, and creams.  °· Use of steroids (by mouth or creams).  °· Previous problems with anesthetics or numbing medicines.  °· History of bleeding problems or blood clots.  °· Previous surgery.  °· Other health problems, including diabetes and kidney problems.  °· Possibility of pregnancy, if this applies.  °· Any barium X-rays during the past week.  °BEFORE THE PROCEDURE  °· Do not eat or drink anything, including water, for at least 6 hours before the procedure.  °· Ask your caregiver whether you should stop taking certain medicines prior to your procedure.  °· Arrange for someone to drive you home. You will not be allowed to drive for several hours after the procedure.  °· Arrive at least 60 minutes before the procedure or as directed. This will give you time to check in and fill out any necessary paperwork.  °PROCEDURE  °· You will be given medicine through a vein (intravenously) to make you relaxed and sleepy.  °· You might have a breathing tube placed to give you medicine that makes you sleep (general anesthetic).  °· Your throat may be sprayed with medicine that numbs the area (local anesthetic).  °· You will lie on your left side. The endoscope  will be inserted through your mouth and into the duodenum. The tube will not interfere with your breathing. Gagging is prevented by the anesthesia.  °· While X-rays are being taken, you may be positioned on your stomach.  °During the ERCP, the person performing the procedure may identify a blockage or narrowed opening to the bile duct. A muscular portion of the main bile duct may be partially cut (sphincterotomy). A thin, plastic tube (stent) will be positioned inside your bile duct. This is to allow fluid secreted by the liver (bile) to flow more easily through the narrowed opening. °AFTER THE PROCEDURE  °· You will rest in bed until you are fully conscious.  °· When you first wake up, your throat may feel slightly sore.  °· You will not be allowed to eat or drink until numbness subsides.  °· Once you are able to drink, urinate, and sit on the edge of the bed without feeling sick to your stomach (nauseous) or dizzy, you may be allowed to go home.  °· Have a friend or family member with you for the first 24 hours after your procedure.  °SEEK MEDICAL CARE IF:  °· You have an oral temperature above 102° F (38.9° C).  °· You develop other signs of infection, including chills or feeling unwell.  °· You have abdominal pain.  °· You have questions or concerns.  °MAKE SURE YOU:  °· Understand these instructions.  °· Will watch your condition.  °· Will get help right away if you are not doing well or get worse.  °  Document Released: 10/31/2000 Document Revised: 10/18/2010 Document Reviewed: 05/24/2009 °Exit[employer]® Patient Information ©2012 [employer]. ° °

## 2014-06-29 NOTE — Addendum Note (Signed)
Addended by: Arta Silence on: 06/29/2014 09:34 AM   Modules accepted: Orders

## 2014-06-30 ENCOUNTER — Encounter (HOSPITAL_COMMUNITY): Payer: Self-pay | Admitting: Gastroenterology

## 2014-07-22 ENCOUNTER — Other Ambulatory Visit: Payer: Self-pay | Admitting: Gastroenterology

## 2014-07-22 ENCOUNTER — Ambulatory Visit
Admission: RE | Admit: 2014-07-22 | Discharge: 2014-07-22 | Disposition: A | Discharge status: Home / Self Care | Payer: PPO | Source: Ambulatory Visit | Attending: Gastroenterology | Admitting: Gastroenterology

## 2014-07-22 DIAGNOSIS — T85898A Other specified complication of other internal prosthetic devices, implants and grafts, initial encounter: Secondary | ICD-10-CM

## 2014-08-02 ENCOUNTER — Encounter (HOSPITAL_COMMUNITY): Payer: Self-pay | Admitting: *Deleted

## 2014-08-02 ENCOUNTER — Other Ambulatory Visit: Payer: Self-pay | Admitting: Gastroenterology

## 2014-08-02 NOTE — Addendum Note (Signed)
Addended by: Arta Silence on: 08/02/2014 04:39 PM   Modules accepted: Orders

## 2014-08-03 ENCOUNTER — Other Ambulatory Visit: Payer: Self-pay | Admitting: Gastroenterology

## 2014-08-03 ENCOUNTER — Ambulatory Visit
Admission: RE | Admit: 2014-08-03 | Discharge: 2014-08-03 | Disposition: A | Discharge status: Home / Self Care | Payer: PPO | Source: Ambulatory Visit | Attending: Gastroenterology | Admitting: Gastroenterology

## 2014-08-03 DIAGNOSIS — T85528A Displacement of other gastrointestinal prosthetic devices, implants and grafts, initial encounter: Secondary | ICD-10-CM

## 2014-08-03 NOTE — Anesthesia Preprocedure Evaluation (Signed)
Anesthesia Evaluation  Patient identified by MRN, date of birth, ID band Patient awake    Reviewed: Allergy & Precautions, NPO status , Patient's Chart, lab work & pertinent test results  History of Anesthesia Complications (+) PONV and history of anesthetic complications  Airway Mallampati: II  TM Distance: >3 FB Neck ROM: Full    Dental no notable dental hx. (+) Dental Advisory Given   Pulmonary neg pulmonary ROS,  breath sounds clear to auscultation  Pulmonary exam normal       Cardiovascular hypertension, Pt. on medications and Pt. on home beta blockers Normal cardiovascular examRhythm:Regular Rate:Normal     Neuro/Psych negative neurological ROS  negative psych ROS   GI/Hepatic negative GI ROS, Neg liver ROS,   Endo/Other  negative endocrine ROS  Renal/GU negative Renal ROS  negative genitourinary   Musculoskeletal  (+) Arthritis -, Osteoarthritis,    Abdominal   Peds negative pediatric ROS (+)  Hematology negative hematology ROS (+)   Anesthesia Other Findings   Reproductive/Obstetrics negative OB ROS                             Anesthesia Physical Anesthesia Plan  ASA: II  Anesthesia Plan: MAC   Post-op Pain Management:    Induction: Intravenous  Airway Management Planned: Nasal Cannula  Additional Equipment:   Intra-op Plan:   Post-operative Plan: Extubation in OR  Informed Consent: I have reviewed the patients History and Physical, chart, labs and discussed the procedure including the risks, benefits and alternatives for the proposed anesthesia with the patient or authorized representative who has indicated his/her understanding and acceptance.   Dental advisory given  Plan Discussed with: CRNA  Anesthesia Plan Comments:         Anesthesia Quick Evaluation

## 2014-08-04 ENCOUNTER — Encounter (HOSPITAL_COMMUNITY): Payer: Self-pay | Admitting: *Deleted

## 2014-08-04 ENCOUNTER — Ambulatory Visit (HOSPITAL_COMMUNITY)
Admission: RE | Admit: 2014-08-04 | Discharge: 2014-08-04 | Disposition: A | Discharge status: Home / Self Care | Payer: PPO | Source: Ambulatory Visit | Attending: Gastroenterology | Admitting: Gastroenterology

## 2014-08-04 ENCOUNTER — Ambulatory Visit (HOSPITAL_COMMUNITY): Payer: PPO | Admitting: Anesthesiology

## 2014-08-04 ENCOUNTER — Encounter (HOSPITAL_COMMUNITY)
Admission: RE | Disposition: A | Discharge status: Home / Self Care | Payer: Self-pay | Source: Ambulatory Visit | Attending: Gastroenterology

## 2014-08-04 DIAGNOSIS — K8050 Calculus of bile duct without cholangitis or cholecystitis without obstruction: Secondary | ICD-10-CM | POA: Diagnosis not present

## 2014-08-04 DIAGNOSIS — Y831 Surgical operation with implant of artificial internal device as the cause of abnormal reaction of the patient, or of later complication, without mention of misadventure at the time of the procedure: Secondary | ICD-10-CM | POA: Insufficient documentation

## 2014-08-04 DIAGNOSIS — T183XXA Foreign body in small intestine, initial encounter: Secondary | ICD-10-CM | POA: Insufficient documentation

## 2014-08-04 DIAGNOSIS — I10 Essential (primary) hypertension: Secondary | ICD-10-CM | POA: Insufficient documentation

## 2014-08-04 DIAGNOSIS — M1990 Unspecified osteoarthritis, unspecified site: Secondary | ICD-10-CM | POA: Insufficient documentation

## 2014-08-04 HISTORY — DX: Diverticulitis of intestine, part unspecified, without perforation or abscess without bleeding: K57.92

## 2014-08-04 HISTORY — PX: ESOPHAGOGASTRODUODENOSCOPY (EGD) WITH PROPOFOL: SHX5813

## 2014-08-04 SURGERY — ESOPHAGOGASTRODUODENOSCOPY (EGD) WITH PROPOFOL
Anesthesia: Monitor Anesthesia Care

## 2014-08-04 MED ORDER — SODIUM CHLORIDE 0.9 % IV SOLN: INTRAVENOUS | Status: DC

## 2014-08-04 MED ORDER — PROPOFOL 10 MG/ML IV BOLUS
INTRAVENOUS | Status: AC
  Filled 2014-08-04: qty 20

## 2014-08-04 MED ORDER — PROPOFOL 10 MG/ML IV BOLUS
INTRAVENOUS | Status: DC | PRN
  Administered 2014-08-04: 100 mg via INTRAVENOUS
  Administered 2014-08-04: 50 mg via INTRAVENOUS

## 2014-08-04 MED ORDER — BUTAMBEN-TETRACAINE-BENZOCAINE 2-2-14 % EX AERO
INHALATION_SPRAY | CUTANEOUS | Status: DC | PRN
  Administered 2014-08-04: 2 via TOPICAL

## 2014-08-04 MED ORDER — LACTATED RINGERS IV SOLN
INTRAVENOUS | Status: DC
  Administered 2014-08-04: 1000 mL via INTRAVENOUS
  Administered 2014-08-04: 09:00:00 via INTRAVENOUS

## 2014-08-04 SURGICAL SUPPLY — 14 items

## 2014-08-04 NOTE — Anesthesia Postprocedure Evaluation (Signed)
  Anesthesia Post-op Note  Patient: Jeffery Harvey  Procedure(s) Performed: Procedure(s) (LRB): ESOPHAGOGASTRODUODENOSCOPY (EGD) WITH PROPOFOL (N/A)  Patient Location: PACU  Anesthesia Type: MAC  Level of Consciousness: awake and alert   Airway and Oxygen Therapy: Patient Spontanous Breathing  Post-op Pain: mild  Post-op Assessment: Post-op Vital signs reviewed, Patient's Cardiovascular Status Stable, Respiratory Function Stable, Patent Airway and No signs of Nausea or vomiting  Last Vitals:  Filed Vitals:   08/04/14 1000  BP: 104/61  Pulse: 58  Temp:   Resp: 12    Post-op Vital Signs: stable   Complications: No apparent anesthesia complications

## 2014-08-04 NOTE — Transfer of Care (Signed)
Immediate Anesthesia Transfer of Care Note  Patient: Jeffery Harvey  Procedure(s) Performed: Procedure(s): ESOPHAGOGASTRODUODENOSCOPY (EGD) WITH PROPOFOL (N/A)  Patient Location: PACU and Endoscopy Unit  Anesthesia Type:MAC  Level of Consciousness: awake, sedated and patient cooperative  Airway & Oxygen Therapy: Patient Spontanous Breathing and Patient connected to nasal cannula oxygen  Post-op Assessment: Report given to RN and Post -op Vital signs reviewed and stable  Post vital signs: Reviewed and stable  Last Vitals:  Filed Vitals:   08/04/14 0829  BP: 143/93  Pulse: 60  Temp: 36.4 C  Resp: 12    Complications: No apparent anesthesia complications

## 2014-08-04 NOTE — H&P (Signed)
Patient interval history reviewed.  Patient examined again.  There has been no change from documented H/P dated 07/22/14 (scanned into chart from our office) except as documented above.  Assessment:  1.  Residual pancreatic duct stent, after ERCP last month for choledocholithiasis.  Plan:  1.  Endoscopy (side-viewing endoscope) for removal of pancreatic duct stent. 2.  Risks (bleeding, infection, bowel perforation that could require surgery, sedation-related changes in cardiopulmonary systems), benefits (identification and possible treatment of source of symptoms, exclusion of certain causes of symptoms), and alternatives (watchful waiting, radiographic imaging studies, empiric medical treatment) of upper endoscopy (EGD) were explained to patient/family in detail and patient wishes to proceed.

## 2014-08-04 NOTE — Op Note (Signed)
[facility] [place], [address]   ENDOSCOPY PROCEDURE REPORT  PATIENT: Jeffery Harvey, Jeffery Harvey  MR#: [id number] BIRTHDATE: 24-Jun-1945 , 4  yrs. old GENDER: male ENDOSCOPIST: Arta Silence, MD REFERRED BY:  Ronald Lobo, M.D. PROCEDURE DATE:  08-21-2014 PROCEDURE:  EGD w/ foreign body removal ASA CLASS:     Class II INDICATIONS:  bile duct stone s/p ERCP, residual pancreatic duct stent in place MEDICATIONS: Monitored anesthesia care TOPICAL ANESTHETIC: Cetacaine Spray  DESCRIPTION OF PROCEDURE: After the risks benefits and alternatives of the procedure were thoroughly explained, informed consent was obtained.  The side-viewing duodenoscope was introduced through the mouth and advanced to the second portion of the duodenum. The instrument was slowly withdrawn as the mucosa was fully examined. Estimated blood loss is zero unless otherwise noted in this procedure report.     Findings:  Pancreatic duct stent still in place within the ampulla; ampulla otherwise normal-appearing with copious bile flow through orifice both at start and at conclusion of procedure.  Stent was grasped with a snare and removed.  Ampulla appeared normal post-stent removal.              The scope was then withdrawn from the patient and the procedure completed.  COMPLICATIONS: There were no immediate complications.  ENDOSCOPIC IMPRESSION:     As above.  Successful removal of pancreatic duct stent.  RECOMMENDATIONS:     1.  Watch for potential complications of procedure. 2.  No further biliary tract intervention anticipated at the present time. 3.  Follow-up with [facility] on as-needed basis.  eSigned:  Arta Silence, MD 21-Aug-2014 9:52 AM   CC:  CPT CODES: ICD CODES:  The ICD and CPT codes recommended by this software are interpretations from the data that the clinical staff has captured with the software.  The verification of the translation of this report to  the ICD and CPT codes and modifiers is the sole responsibility of the health care institution and practicing physician where this report was generated.  [employer]. will not be held responsible for the validity of the ICD and CPT codes included on this report.  AMA assumes no liability for data contained or not contained herein. CPT is a Designer, television/film set of the [employer].

## 2014-08-04 NOTE — Discharge Instructions (Signed)
Endoscopy °Care After °Please read the instructions outlined below and refer to this sheet in the next few weeks. These discharge instructions provide you with general information on caring for yourself after you leave the hospital. Your doctor may also give you specific instructions. While your treatment has been planned according to the most current medical practices available, unavoidable complications occasionally occur. If you have any problems or questions after discharge, please call Dr. Lakenzie Mcclafferty ([facility]) at [phone]. ° °HOME CARE INSTRUCTIONS °Activity °· You may resume your regular activity but move at a slower pace for the next 24 hours.  °· Take frequent rest periods for the next 24 hours.  °· Walking will help expel (get rid of) the air and reduce the bloated feeling in your abdomen.  °· No driving for 24 hours (because of the anesthesia (medicine) used during the test).  °· You may shower.  °· Do not sign any important legal documents or operate any machinery for 24 hours (because of the anesthesia used during the test).  °Nutrition °· Drink plenty of fluids.  °· You may resume your normal diet.  °· Begin with a light meal and progress to your normal diet.  °· Avoid alcoholic beverages for 24 hours or as instructed by your caregiver.  °Medications °You may resume your normal medications unless your caregiver tells you otherwise. °What you can expect today °· You may experience abdominal discomfort such as a feeling of fullness or "gas" pains.  °· You may experience a sore throat for 2 to 3 days. This is normal. Gargling with salt water may help this.  °·  °SEEK IMMEDIATE MEDICAL CARE IF: °· You have excessive nausea (feeling sick to your stomach) and/or vomiting.  °· You have severe abdominal pain and distention (swelling).  °· You have trouble swallowing.  °· You have a temperature over 100° F (37.8° C).  °· You have rectal bleeding or vomiting of blood.  °Document Released:  09/20/2003 Document Revised: 10/18/2010 Document Reviewed: 04/02/2007 °ExitCare® Patient Information ©2012 [employer]. °

## 2014-08-05 ENCOUNTER — Encounter (HOSPITAL_COMMUNITY): Payer: Self-pay | Admitting: Gastroenterology

## 2015-03-07 DIAGNOSIS — R55 Syncope and collapse: Secondary | ICD-10-CM | POA: Diagnosis not present

## 2015-03-07 DIAGNOSIS — R7301 Impaired fasting glucose: Secondary | ICD-10-CM | POA: Diagnosis not present

## 2015-03-09 ENCOUNTER — Other Ambulatory Visit: Payer: Self-pay | Admitting: Cardiology

## 2015-03-09 DIAGNOSIS — I10 Essential (primary) hypertension: Secondary | ICD-10-CM

## 2015-03-09 MED ORDER — LOSARTAN POTASSIUM 50 MG PO TABS: 50.0000 mg | ORAL_TABLET | Freq: Every day | ORAL | Status: DC

## 2015-03-09 NOTE — Telephone Encounter (Signed)
°*  STAT* If patient is at the pharmacy, call can be transferred to refill team.   1. Which medications need to be refilled? (please list name of each medication and dose if known) Losartan   2. Which pharmacy/location (including street and city if local pharmacy) is medication to be sent to?[facility] [place]   3. Do they need a 30 day or 90 day supply?30   * he is completely out*

## 2015-03-15 ENCOUNTER — Ambulatory Visit (INDEPENDENT_AMBULATORY_CARE_PROVIDER_SITE_OTHER): Payer: PPO | Admitting: Cardiology

## 2015-03-15 ENCOUNTER — Encounter: Payer: Self-pay | Admitting: Cardiology

## 2015-03-15 VITALS — BP 120/86 | HR 60 | Ht 74.5 in | Wt 214.2 lb

## 2015-03-15 DIAGNOSIS — R42 Dizziness and giddiness: Secondary | ICD-10-CM | POA: Diagnosis not present

## 2015-03-15 DIAGNOSIS — I10 Essential (primary) hypertension: Secondary | ICD-10-CM | POA: Diagnosis not present

## 2015-03-15 NOTE — Progress Notes (Signed)
Jeffery Harvey Date of Birth: 07-Nov-1945 Medical Record [id number]  History of Present Illness: Jeffery Harvey is seen  for follow up of HTN. He has HTN, gout and occasional GERD. He has no known CAD. Echocardiogram and Cardiolite studies in May of 2012 are unremarkable.  BP has been well controlled. No complaints of chest pain or SOB. He underwent cholecystectomy in August 2015 at [facility]. He had ERCP done this past summer by Dr. Paulita Fujita.  On follow today he reports 2 spells of dizziness. The first occurred while standing in his office. He suddenly felt lightheaded and felt like he needed to kneel down. This lasted 15-20 seconds and resolved. A couple of weeks later he had another episode while sitting in a recliner. No true vertigo symptoms. No palpitations, nausea, sweating, chest pain or SOB. No other recurrence. Overall feels very well. 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     Current Outpatient Prescriptions on File Prior to Visit  Medication Sig Dispense Refill  . carvedilol (COREG) 12.5 MG tablet Take 1 tablet (12.5 mg total) by mouth 2 (two) times daily. 180 tablet 3  . DULoxetine (CYMBALTA) 30 MG capsule Take 60 mg by mouth every morning.     . Febuxostat (ULORIC) 80 MG TABS Take 40 mg by mouth every evening.     . naproxen sodium (ANAPROX) 220 MG tablet Take 1 tablet (220 mg total) by mouth 2 (two) times daily with a meal. (Patient taking differently: Take 220 mg by mouth as needed. )    . OPANA ER, CRUSH RESISTANT, 10 MG T12A 12 hr tablet Take 10 mg by mouth 2 (two) times daily.     . ranitidine (ZANTAC) 150 MG tablet Take 150 mg by mouth daily as needed for heartburn.     . zolpidem (AMBIEN) 10 MG tablet Take 10 mg by mouth at bedtime as needed for sleep.      No current facility-administered medications on file prior to visit.    Allergies  Allergen Reactions  . Allopurinol Swelling  . Amlodipine Besy-Benazepril Hcl     UNKNOWN  . Ancef [Cefazolin] Hives   . Dexilant [Dexlansoprazole] Nausea And Vomiting  . Flagyl [Metronidazole] Hives  . Penicillins Hives  . Vioxx [Rofecoxib] Swelling    Past Medical History  Diagnosis Date  . GI bleed   . Gout   . Hypertension   . Chronic back pain     chronic back pain- see pain management MD  . PONV (postoperative nausea and vomiting)   . Swallowing difficulty     can swallow, but often times has to spit up" bring up a lot of foamy tough"  . Arthritis     narrowing and disc degeneration of lower back"chroin pain"  . Transfusion history     x2 episodes -post procedure polypectomies at different times under 2 yrs ago  . Cancer ([place])     skin cancer  . Diverticulitis     had tiny perforation of bowel- 04/2014    Past Surgical History  Procedure Laterality Date  . Cervical fusion  2000  . Hernia repair      Bilateral  . Colonoscopy w/ biopsies and polypectomy    . Cholecystectomy, laparoscopic  8/15  . Appendectomy      open,"rupture"  . Eus N/A 05/26/2014    Procedure: FULL UPPER ENDOSCOPIC ULTRASOUND (EUS) RADIAL;  Surgeon: Arta Silence, MD;  Location: [facility];  Service: Endoscopy;  Laterality: N/A;  . Ercp N/A  06/02/2014    Procedure: ENDOSCOPIC RETROGRADE CHOLANGIOPANCREATOGRAPHY (ERCP);  Surgeon: Arta Silence, MD;  Location: [facility];  Service: Endoscopy;  Laterality: N/A;  . Eus N/A 06/29/2014    Procedure: UPPER ENDOSCOPIC ULTRASOUND (EUS) RADIAL;  Surgeon: Arta Silence, MD;  Location: [facility];  Service: Endoscopy;  Laterality: N/A;  . Ercp N/A 06/29/2014    Procedure: ENDOSCOPIC RETROGRADE CHOLANGIOPANCREATOGRAPHY (ERCP);  Surgeon: Arta Silence, MD;  Location: [facility];  Service: Endoscopy;  Laterality: N/A;  . Cholecystectomy    . Esophagogastroduodenoscopy (egd) with propofol N/A 08/04/2014    Procedure: ESOPHAGOGASTRODUODENOSCOPY (EGD) WITH PROPOFOL;  Surgeon: Arta Silence, MD;  Location: [facility];  Service: Endoscopy;  Laterality: N/A;    History   Smoking status  . Never Smoker   Smokeless tobacco  . Not on file    History  Alcohol Use No    Comment: none in 2 years    Family History  Problem Relation Age of Onset  . Other Father 98    Blood Disorder  . Heart failure Mother 68  . Colon cancer Mother   . Hypertension Sister 49    Valve repaired    Review of Systems: The review of systems is per the HPI.  All other systems were reviewed and are negative.  Physical Exam: BP 120/86 mmHg  Pulse 60  Ht 6' 2.5" (1.892 m)  Wt 97.183 kg (214 lb 4 oz)  BMI 27.15 kg/m2 Patient is very pleasant and in no acute distress. Skin is warm and dry.  HEENT is unremarkable. Normocephalic/atraumatic. PERRL. Sclera are nonicteric. Neck is supple. No masses. No JVD. Lungs are clear. Cardiac exam shows a regular rate and rhythm. Abdomen is soft. Extremities are without edema. Gait and ROM are intact. No gross neurologic deficits noted.   LABORATORY DATA:  Ecg: done earlier this month was reported normal.  Labs done 03/07/15 reviewed. Normal CMET, CBC, TSH, A1c.   Assessment / Plan: 1. Hypertension- Well controlled.  Continue coreg and losartan. Follow up in one year. Labs reviewed from [facility] via [facility].   2. Episodes of lightheadedness. Etiology unclear. Sounds more vagally mediated. No clear trigger. Will observe for now. If symptoms recur may need to consider further evaluation with event monitor +/- ETT.

## 2015-03-15 NOTE — Patient Instructions (Signed)
Focus on exercise and weight loss  Let me know if the dizzy spells continue

## 2015-03-23 ENCOUNTER — Other Ambulatory Visit: Payer: Self-pay

## 2015-03-23 MED ORDER — LOSARTAN POTASSIUM 50 MG PO TABS: 50.0000 mg | ORAL_TABLET | Freq: Every day | ORAL | Status: DC

## 2015-03-23 NOTE — Telephone Encounter (Signed)
Rx(s) sent to pharmacy electronically.  

## 2015-05-04 DIAGNOSIS — M4806 Spinal stenosis, lumbar region: Secondary | ICD-10-CM | POA: Diagnosis not present

## 2015-05-04 DIAGNOSIS — M47897 Other spondylosis, lumbosacral region: Secondary | ICD-10-CM | POA: Diagnosis not present

## 2015-05-13 ENCOUNTER — Other Ambulatory Visit: Payer: Self-pay | Admitting: *Deleted

## 2015-05-13 MED ORDER — CARVEDILOL 12.5 MG PO TABS: 12.5000 mg | ORAL_TABLET | Freq: Two times a day (BID) | ORAL | Status: DC

## 2015-06-15 DIAGNOSIS — I10 Essential (primary) hypertension: Secondary | ICD-10-CM | POA: Diagnosis not present

## 2015-06-15 DIAGNOSIS — N400 Enlarged prostate without lower urinary tract symptoms: Secondary | ICD-10-CM | POA: Diagnosis not present

## 2015-07-14 DIAGNOSIS — R351 Nocturia: Secondary | ICD-10-CM | POA: Diagnosis not present

## 2015-07-14 DIAGNOSIS — I10 Essential (primary) hypertension: Secondary | ICD-10-CM | POA: Diagnosis not present

## 2015-07-27 DIAGNOSIS — M47897 Other spondylosis, lumbosacral region: Secondary | ICD-10-CM | POA: Diagnosis not present

## 2015-07-27 DIAGNOSIS — M4806 Spinal stenosis, lumbar region: Secondary | ICD-10-CM | POA: Diagnosis not present

## 2015-08-31 DIAGNOSIS — D0472 Carcinoma in situ of skin of left lower limb, including hip: Secondary | ICD-10-CM | POA: Diagnosis not present

## 2015-08-31 DIAGNOSIS — Z85828 Personal history of other malignant neoplasm of skin: Secondary | ICD-10-CM | POA: Diagnosis not present

## 2015-08-31 DIAGNOSIS — D225 Melanocytic nevi of trunk: Secondary | ICD-10-CM | POA: Diagnosis not present

## 2015-08-31 DIAGNOSIS — L821 Other seborrheic keratosis: Secondary | ICD-10-CM | POA: Diagnosis not present

## 2015-08-31 DIAGNOSIS — L570 Actinic keratosis: Secondary | ICD-10-CM | POA: Diagnosis not present

## 2015-08-31 DIAGNOSIS — D485 Neoplasm of uncertain behavior of skin: Secondary | ICD-10-CM | POA: Diagnosis not present

## 2015-09-28 ENCOUNTER — Encounter (HOSPITAL_COMMUNITY): Payer: Self-pay

## 2015-09-28 ENCOUNTER — Inpatient Hospital Stay (HOSPITAL_COMMUNITY)
Admission: EM | Admit: 2015-09-28 | Discharge: 2015-10-05 | DRG: 330 | Disposition: A | Discharge status: Home / Self Care | Payer: PPO | Attending: General Surgery | Admitting: General Surgery

## 2015-09-28 DIAGNOSIS — Z85828 Personal history of other malignant neoplasm of skin: Secondary | ICD-10-CM

## 2015-09-28 DIAGNOSIS — R110 Nausea: Secondary | ICD-10-CM | POA: Diagnosis not present

## 2015-09-28 DIAGNOSIS — Z0189 Encounter for other specified special examinations: Secondary | ICD-10-CM | POA: Diagnosis not present

## 2015-09-28 DIAGNOSIS — Z981 Arthrodesis status: Secondary | ICD-10-CM | POA: Diagnosis not present

## 2015-09-28 DIAGNOSIS — Z9049 Acquired absence of other specified parts of digestive tract: Secondary | ICD-10-CM

## 2015-09-28 DIAGNOSIS — Z6827 Body mass index (BMI) 27.0-27.9, adult: Secondary | ICD-10-CM

## 2015-09-28 DIAGNOSIS — R1011 Right upper quadrant pain: Secondary | ICD-10-CM | POA: Diagnosis not present

## 2015-09-28 DIAGNOSIS — M549 Dorsalgia, unspecified: Secondary | ICD-10-CM | POA: Diagnosis not present

## 2015-09-28 DIAGNOSIS — Z79899 Other long term (current) drug therapy: Secondary | ICD-10-CM

## 2015-09-28 DIAGNOSIS — K660 Peritoneal adhesions (postprocedural) (postinfection): Secondary | ICD-10-CM | POA: Diagnosis present

## 2015-09-28 DIAGNOSIS — M109 Gout, unspecified: Secondary | ICD-10-CM | POA: Diagnosis not present

## 2015-09-28 DIAGNOSIS — I10 Essential (primary) hypertension: Secondary | ICD-10-CM | POA: Diagnosis present

## 2015-09-28 DIAGNOSIS — M1990 Unspecified osteoarthritis, unspecified site: Secondary | ICD-10-CM | POA: Diagnosis present

## 2015-09-28 DIAGNOSIS — K5790 Diverticulosis of intestine, part unspecified, without perforation or abscess without bleeding: Secondary | ICD-10-CM | POA: Diagnosis not present

## 2015-09-28 DIAGNOSIS — Z8249 Family history of ischemic heart disease and other diseases of the circulatory system: Secondary | ICD-10-CM

## 2015-09-28 DIAGNOSIS — R1084 Generalized abdominal pain: Secondary | ICD-10-CM | POA: Diagnosis not present

## 2015-09-28 DIAGNOSIS — Z5331 Laparoscopic surgical procedure converted to open procedure: Secondary | ICD-10-CM | POA: Diagnosis not present

## 2015-09-28 DIAGNOSIS — K5700 Diverticulitis of small intestine with perforation and abscess without bleeding: Secondary | ICD-10-CM | POA: Diagnosis not present

## 2015-09-28 LAB — COMPREHENSIVE METABOLIC PANEL
ALBUMIN: 4.2 g/dL (ref 3.5–5.0)
ALT: 18 U/L (ref 17–63)
AST: 18 U/L (ref 15–41)
Alkaline Phosphatase: 62 U/L (ref 38–126)
Anion gap: 9 (ref 5–15)
BUN: 16 mg/dL (ref 6–20)
CHLORIDE: 102 mmol/L (ref 101–111)
CO2: 24 mmol/L (ref 22–32)
CREATININE: 1.29 mg/dL — AB (ref 0.61–1.24)
Calcium: 9 mg/dL (ref 8.9–10.3)
GFR calc non Af Amer: 55 mL/min — ABNORMAL LOW (ref >60)
GLUCOSE: 121 mg/dL — AB (ref 65–99)
Potassium: 4 mmol/L (ref 3.5–5.1)
SODIUM: 135 mmol/L (ref 135–145)
TOTAL PROTEIN: 6.5 g/dL (ref 6.5–8.1)
Total Bilirubin: 1.4 mg/dL — ABNORMAL HIGH (ref 0.3–1.2)

## 2015-09-28 LAB — CBC
HEMATOCRIT: 41.4 % (ref 39.0–52.0)
Hemoglobin: 14.4 g/dL (ref 13.0–17.0)
MCH: 31.1 pg (ref 26.0–34.0)
MCHC: 34.8 g/dL (ref 30.0–36.0)
MCV: 89.4 fL (ref 78.0–100.0)
Platelets: 143 10*3/uL — ABNORMAL LOW (ref 150–400)
RBC: 4.63 MIL/uL (ref 4.22–5.81)
RDW: 12.3 % (ref 11.5–15.5)
WBC: 12.6 10*3/uL — ABNORMAL HIGH (ref 4.0–10.5)

## 2015-09-28 LAB — URINALYSIS, ROUTINE W REFLEX MICROSCOPIC
Glucose, UA: NEGATIVE mg/dL
HGB URINE DIPSTICK: NEGATIVE
KETONES UR: 15 mg/dL — AB
Leukocytes, UA: NEGATIVE
NITRITE: NEGATIVE
PH: 5.5 (ref 5.0–8.0)
Protein, ur: NEGATIVE mg/dL

## 2015-09-28 LAB — I-STAT TROPONIN, ED: Troponin i, poc: 0.01 ng/mL (ref 0.00–0.08)

## 2015-09-28 LAB — TYPE AND SCREEN
ABO/RH(D): O POS
ANTIBODY SCREEN: NEGATIVE

## 2015-09-28 LAB — LIPASE, BLOOD: Lipase: 16 U/L (ref 11–51)

## 2015-09-28 LAB — ABO/RH: ABO/RH(D): O POS

## 2015-09-28 LAB — I-STAT CG4 LACTIC ACID, ED: Lactic Acid, Venous: 1.89 mmol/L (ref 0.5–1.9)

## 2015-09-28 MED ORDER — CARVEDILOL 12.5 MG PO TABS
12.5000 mg | ORAL_TABLET | Freq: Two times a day (BID) | ORAL | Status: DC
  Administered 2015-09-28 – 2015-10-05 (×12): 12.5 mg via ORAL
  Filled 2015-09-28 (×14): qty 1

## 2015-09-28 MED ORDER — ZOLPIDEM TARTRATE 5 MG PO TABS
5.0000 mg | ORAL_TABLET | Freq: Every day | ORAL | Status: DC
  Administered 2015-09-28: 5 mg via ORAL
  Filled 2015-09-28: qty 1

## 2015-09-28 MED ORDER — ACETAMINOPHEN 325 MG PO TABS
650.0000 mg | ORAL_TABLET | Freq: Once | ORAL | Status: AC | PRN
  Administered 2015-09-28: 650 mg via ORAL
  Filled 2015-09-28: qty 2

## 2015-09-28 MED ORDER — SODIUM CHLORIDE 0.9 % IV SOLN
2.0000 g | Freq: Three times a day (TID) | INTRAVENOUS | Status: DC
  Administered 2015-09-28 – 2015-09-29 (×3): 2 g via INTRAVENOUS
  Filled 2015-09-28 (×5): qty 2

## 2015-09-28 MED ORDER — DULOXETINE HCL 60 MG PO CPEP: 60.0000 mg | ORAL_CAPSULE | Freq: Every morning | ORAL | Status: DC

## 2015-09-28 MED ORDER — LOSARTAN POTASSIUM 50 MG PO TABS
50.0000 mg | ORAL_TABLET | Freq: Every day | ORAL | Status: DC
  Administered 2015-09-30 – 2015-10-05 (×6): 50 mg via ORAL
  Filled 2015-09-28 (×7): qty 1

## 2015-09-28 MED ORDER — ONDANSETRON 4 MG PO TBDP: 4.0000 mg | ORAL_TABLET | Freq: Four times a day (QID) | ORAL | Status: DC | PRN

## 2015-09-28 MED ORDER — KCL IN DEXTROSE-NACL 20-5-0.45 MEQ/L-%-% IV SOLN
INTRAVENOUS | Status: DC
  Administered 2015-09-28 – 2015-09-29 (×2): via INTRAVENOUS
  Administered 2015-09-29 – 2015-09-30 (×2): 1 mL via INTRAVENOUS
  Administered 2015-09-30 – 2015-10-01 (×2): via INTRAVENOUS
  Administered 2015-10-01: 1 mL via INTRAVENOUS
  Administered 2015-10-01 – 2015-10-03 (×5): via INTRAVENOUS
  Filled 2015-09-28 (×14): qty 1000

## 2015-09-28 MED ORDER — FAMOTIDINE IN NACL 20-0.9 MG/50ML-% IV SOLN
20.0000 mg | INTRAVENOUS | Status: DC
  Administered 2015-09-29 – 2015-10-03 (×5): 20 mg via INTRAVENOUS
  Filled 2015-09-28 (×6): qty 50

## 2015-09-28 MED ORDER — ENOXAPARIN SODIUM 40 MG/0.4ML ~~LOC~~ SOLN: 40.0000 mg | SUBCUTANEOUS | Status: DC

## 2015-09-28 MED ORDER — FENTANYL CITRATE (PF) 100 MCG/2ML IJ SOLN
50.0000 ug | Freq: Once | INTRAMUSCULAR | Status: AC
Start: 2015-09-28 — End: 2015-09-28
  Administered 2015-09-28: 50 ug via INTRAVENOUS
  Filled 2015-09-28: qty 2

## 2015-09-28 MED ORDER — HYDROMORPHONE HCL 1 MG/ML IJ SOLN
1.0000 mg | INTRAMUSCULAR | Status: DC | PRN
  Administered 2015-09-28: 2 mg via INTRAVENOUS
  Administered 2015-09-28: 1 mg via INTRAVENOUS
  Administered 2015-09-29 (×3): 2 mg via INTRAVENOUS
  Filled 2015-09-28 (×2): qty 2
  Filled 2015-09-28: qty 1
  Filled 2015-09-28 (×2): qty 2

## 2015-09-28 MED ORDER — ONDANSETRON HCL 4 MG/2ML IJ SOLN: 4.0000 mg | Freq: Four times a day (QID) | INTRAMUSCULAR | Status: DC | PRN

## 2015-09-28 NOTE — H&P (Signed)
Jeffery Harvey is an 70 y.o. male.   Chief Complaint: Abdominal pain HPI: Pain started two days ago but resolved by the next day.  Recurred this AM, he went to his primary care physician, subsequently got CT scan at a [facility] facility, was asked to come to hospital because of diverticular perforation with abscess  Past Medical History:  Diagnosis Date  . Arthritis    narrowing and disc degeneration of lower back"chroin pain"  . Cancer ([place])    skin cancer  . Chronic back pain    chronic back pain- see pain management MD  . Diverticulitis    had tiny perforation of bowel- 04/2014  . GI bleed   . Gout   . Hypertension   . PONV (postoperative nausea and vomiting)   . Swallowing difficulty    can swallow, but often times has to spit up" bring up a lot of foamy tough"  . Transfusion history    x2 episodes -post procedure polypectomies at different times under 2 yrs ago    Past Surgical History:  Procedure Laterality Date  . APPENDECTOMY     open,"rupture"  . CERVICAL FUSION  2000  . CHOLECYSTECTOMY    . CHOLECYSTECTOMY, LAPAROSCOPIC  8/15  . COLONOSCOPY W/ BIOPSIES AND POLYPECTOMY    . ERCP N/A 06/02/2014   Procedure: ENDOSCOPIC RETROGRADE CHOLANGIOPANCREATOGRAPHY (ERCP);  Surgeon: Arta Silence, MD;  Location: [facility];  Service: Endoscopy;  Laterality: N/A;  . ERCP N/A 06/29/2014   Procedure: ENDOSCOPIC RETROGRADE CHOLANGIOPANCREATOGRAPHY (ERCP);  Surgeon: Arta Silence, MD;  Location: [facility];  Service: Endoscopy;  Laterality: N/A;  . ESOPHAGOGASTRODUODENOSCOPY (EGD) WITH PROPOFOL N/A 08/04/2014   Procedure: ESOPHAGOGASTRODUODENOSCOPY (EGD) WITH PROPOFOL;  Surgeon: Arta Silence, MD;  Location: [facility];  Service: Endoscopy;  Laterality: N/A;  . EUS N/A 05/26/2014   Procedure: FULL UPPER ENDOSCOPIC ULTRASOUND (EUS) RADIAL;  Surgeon: Arta Silence, MD;  Location: [facility];  Service: Endoscopy;  Laterality: N/A;  . EUS N/A 06/29/2014   Procedure: UPPER ENDOSCOPIC  ULTRASOUND (EUS) RADIAL;  Surgeon: Arta Silence, MD;  Location: [facility];  Service: Endoscopy;  Laterality: N/A;  . HERNIA REPAIR     Bilateral    Family History  Problem Relation Age of Onset  . Other Father 76    Blood Disorder  . Heart failure Mother 43  . Colon cancer Mother   . Hypertension Sister 40    Valve repaired   Social History:  reports that he has never smoked. He has never used smokeless tobacco. He reports that he does not use drugs. His alcohol history is not on file.  Allergies:  Allergies  Allergen Reactions  . Flagyl [Metronidazole] Hives    Reported by patient's wife  . Penicillins Hives    Has patient had a PCN reaction causing immediate rash, facial/tongue/throat swelling, SOB or lightheadedness with hypotension: Yes Has patient had a PCN reaction causing severe rash involving mucus membranes or skin necrosis: No Has patient had a PCN reaction that required hospitalization No Has patient had a PCN reaction occurring within the last 10 years: Yes If all of the above answers are "NO", then may proceed with Cephalosporin use.   . Allopurinol Swelling  . Amlodipine Besy-Benazepril Hcl     UNKNOWN  . Ancef [Cefazolin] Hives  . Dexilant [Dexlansoprazole] Nausea And Vomiting  . Vioxx [Rofecoxib] Swelling     (Not in a hospital admission)  Results for orders placed or performed during the hospital encounter of 09/28/15 (from the past  48 hour(s))  Urinalysis, Routine w reflex microscopic     Status: Abnormal   Collection Time: 09/28/15  4:30 PM  Result Value Ref Range   Color, Urine AMBER (A) YELLOW    Comment: BIOCHEMICALS MAY BE AFFECTED BY COLOR   APPearance CLEAR CLEAR   Specific Gravity, Urine >1.046 (H) 1.005 - 1.030   pH 5.5 5.0 - 8.0   Glucose, UA NEGATIVE NEGATIVE mg/dL   Hgb urine dipstick NEGATIVE NEGATIVE   Bilirubin Urine SMALL (A) NEGATIVE   Ketones, ur 15 (A) NEGATIVE mg/dL   Protein, ur NEGATIVE NEGATIVE mg/dL   Nitrite NEGATIVE  NEGATIVE   Leukocytes, UA NEGATIVE NEGATIVE    Comment: MICROSCOPIC NOT DONE ON URINES WITH NEGATIVE PROTEIN, BLOOD, LEUKOCYTES, NITRITE, OR GLUCOSE <1000 mg/dL.  Lipase, blood     Status: None   Collection Time: 09/28/15  4:31 PM  Result Value Ref Range   Lipase 16 11 - 51 U/L  Comprehensive metabolic panel     Status: Abnormal   Collection Time: 09/28/15  4:31 PM  Result Value Ref Range   Sodium 135 135 - 145 mmol/L   Potassium 4.0 3.5 - 5.1 mmol/L   Chloride 102 101 - 111 mmol/L   CO2 24 22 - 32 mmol/L   Glucose, Bld 121 (H) 65 - 99 mg/dL   BUN 16 6 - 20 mg/dL   Creatinine, Ser 1.29 (H) 0.61 - 1.24 mg/dL   Calcium 9.0 8.9 - 10.3 mg/dL   Total Protein 6.5 6.5 - 8.1 g/dL   Albumin 4.2 3.5 - 5.0 g/dL   AST 18 15 - 41 U/L   ALT 18 17 - 63 U/L   Alkaline Phosphatase 62 38 - 126 U/L   Total Bilirubin 1.4 (H) 0.3 - 1.2 mg/dL   GFR calc non Af Amer 55 (L) >60 mL/min   GFR calc Af Amer >60 >60 mL/min    Comment: (NOTE) The eGFR has been calculated using the CKD EPI equation. This calculation has not been validated in all clinical situations. eGFR's persistently <60 mL/min signify possible Chronic Kidney Disease.    Anion gap 9 5 - 15  CBC     Status: Abnormal   Collection Time: 09/28/15  4:31 PM  Result Value Ref Range   WBC 12.6 (H) 4.0 - 10.5 K/uL   RBC 4.63 4.22 - 5.81 MIL/uL   Hemoglobin 14.4 13.0 - 17.0 g/dL   HCT 41.4 39.0 - 52.0 %   MCV 89.4 78.0 - 100.0 fL   MCH 31.1 26.0 - 34.0 pg   MCHC 34.8 30.0 - 36.0 g/dL   RDW 12.3 11.5 - 15.5 %   Platelets 143 (L) 150 - 400 K/uL  Type and screen [place]     Status: None   Collection Time: 09/28/15  6:03 PM  Result Value Ref Range   ABO/RH(D) O POS    Antibody Screen NEG    Sample Expiration 10/01/2015   I-stat troponin, ED     Status: None   Collection Time: 09/28/15  6:55 PM  Result Value Ref Range   Troponin i, poc 0.01 0.00 - 0.08 ng/mL   Comment 3            Comment: Due to the release kinetics  of cTnI, a negative result within the first hours of the onset of symptoms does not rule out myocardial infarction with certainty. If myocardial infarction is still suspected, repeat the test at appropriate intervals.  I-Stat CG4 Lactic Acid, ED     Status: None   Collection Time: 09/28/15  6:57 PM  Result Value Ref Range   Lactic Acid, Venous 1.89 0.5 - 1.9 mmol/L   No results found.  Review of Systems  Constitutional: Positive for chills and fever.  Gastrointestinal: Positive for abdominal pain and nausea.  All other systems reviewed and are negative.   Blood pressure 119/66, pulse 79, temperature 100.7 F (38.2 C), temperature source Oral, resp. rate 19, height 6' 2.5" (1.892 m), weight 97.1 kg (214 lb), SpO2 95 %. Physical Exam  Constitutional: He is oriented to person, place, and time. He appears well-developed and well-nourished.  HENT:  Head: Normocephalic and atraumatic.  Eyes: Conjunctivae and EOM are normal. Pupils are equal, round, and reactive to light.  Neck: Normal range of motion. Neck supple.  Cardiovascular: Normal rate, regular rhythm and normal heart sounds.   Respiratory: Effort normal and breath sounds normal.  GI: Soft. Normal appearance and bowel sounds are normal. He exhibits no distension. There is tenderness in the left upper quadrant. There is no rigidity, no rebound and no guarding. No hernia.    Genitourinary: Penis normal.  Musculoskeletal: Normal range of motion.  Neurological: He is alert and oriented to person, place, and time. He has normal reflexes.  Skin: Skin is warm and dry.  Psychiatric: He has a normal mood and affect. His behavior is normal. Judgment and thought content normal.     Assessment/Plan Apparent small bowel perforated diverticulum with a peridiverticular abscess.  No peritonitis.  No sepsis.  Pain originally 8/10, now a4-5/10 after medications.  Has not received antibiotics yet.  Admit the patient to telemetry. IV  antibiotics.  He will need surgery tomorrow AM, possible laparoscopic but SB resection with likely anastomosis.  The patient has a number of allergies and will be place on carbipenem/Merrem.  Judeth Horn, MD 09/28/2015, 7:27 PM

## 2015-09-28 NOTE — ED Notes (Signed)
Attempted to call report and RN unavailable due to pt care

## 2015-09-28 NOTE — ED Provider Notes (Signed)
[facility] DEPT Provider Note   CSN: [id number] Arrival date & time: 09/28/15  1619  First Provider Contact:  None       History   Chief Complaint Chief Complaint  Patient presents with  . Abdominal Pain    HPI ATHEL ELBERS is a 70 y.o. male.  HPI   Patient is a 70 year old male with history of diverticulitis presenting today with right upper quadrant/quadrant pain. Patient had outside CT done showing diverticulitis with perforation and abscess.  Patient had the pain starting on Monday. Mild nausea. Low-grade fever.  Past Medical History:  Diagnosis Date  . Arthritis    narrowing and disc degeneration of lower back"chroin pain"  . Cancer ([place])    skin cancer  . Chronic back pain    chronic back pain- see pain management MD  . Diverticulitis    had tiny perforation of bowel- 04/2014  . GI bleed   . Gout   . Hypertension   . PONV (postoperative nausea and vomiting)   . Swallowing difficulty    can swallow, but often times has to spit up" bring up a lot of foamy tough"  . Transfusion history    x2 episodes -post procedure polypectomies at different times under 2 yrs ago    Patient Active Problem List   Diagnosis Date Noted  . Diverticulitis of intestine with perforation and abscess 09/28/2015  . Dizziness 03/15/2015  . Gout 11/08/2011  . HTN (hypertension) 10/08/2011    Past Surgical History:  Procedure Laterality Date  . APPENDECTOMY     open,"rupture"  . CERVICAL FUSION  2000  . CHOLECYSTECTOMY    . CHOLECYSTECTOMY, LAPAROSCOPIC  8/15  . COLONOSCOPY W/ BIOPSIES AND POLYPECTOMY    . ERCP N/A 06/02/2014   Procedure: ENDOSCOPIC RETROGRADE CHOLANGIOPANCREATOGRAPHY (ERCP);  Surgeon: Arta Silence, MD;  Location: [facility];  Service: Endoscopy;  Laterality: N/A;  . ERCP N/A 06/29/2014   Procedure: ENDOSCOPIC RETROGRADE CHOLANGIOPANCREATOGRAPHY (ERCP);  Surgeon: Arta Silence, MD;  Location: [facility];  Service: Endoscopy;  Laterality: N/A;  .  ESOPHAGOGASTRODUODENOSCOPY (EGD) WITH PROPOFOL N/A 08/04/2014   Procedure: ESOPHAGOGASTRODUODENOSCOPY (EGD) WITH PROPOFOL;  Surgeon: Arta Silence, MD;  Location: [facility];  Service: Endoscopy;  Laterality: N/A;  . EUS N/A 05/26/2014   Procedure: FULL UPPER ENDOSCOPIC ULTRASOUND (EUS) RADIAL;  Surgeon: Arta Silence, MD;  Location: [facility];  Service: Endoscopy;  Laterality: N/A;  . EUS N/A 06/29/2014   Procedure: UPPER ENDOSCOPIC ULTRASOUND (EUS) RADIAL;  Surgeon: Arta Silence, MD;  Location: [facility];  Service: Endoscopy;  Laterality: N/A;  . HERNIA REPAIR     Bilateral       Home Medications    Prior to Admission medications   Medication Sig Start Date End Date Taking? Authorizing Provider  carvedilol (COREG) 12.5 MG tablet Take 1 tablet (12.5 mg total) by mouth 2 (two) times daily. 05/13/15  Yes Peter M Martinique, MD  DULoxetine (CYMBALTA) 60 MG capsule Take 60 mg by mouth daily.   Yes Historical Provider, MD  Febuxostat (ULORIC) 80 MG TABS Take 40 mg by mouth every evening.    Yes Historical Provider, MD  losartan (COZAAR) 50 MG tablet Take 1 tablet (50 mg total) by mouth daily. 03/23/15  Yes Peter M Martinique, MD  OPANA ER, CRUSH RESISTANT, 10 MG T12A 12 hr tablet Take 10 mg by mouth 2 (two) times daily.  07/20/13  Yes Historical Provider, MD  ranitidine (ZANTAC) 150 MG tablet Take 150 mg by mouth daily as needed for  heartburn.    Yes Historical Provider, MD  tamsulosin (FLOMAX) 0.4 MG CAPS capsule Take 0.4 mg by mouth daily after supper.   Yes Historical Provider, MD  zolpidem (AMBIEN) 10 MG tablet Take 10 mg by mouth at bedtime. Take every night per patient   Yes Historical Provider, MD    Family History Family History  Problem Relation Age of Onset  . Other Father 52    Blood Disorder  . Heart failure Mother 74  . Colon cancer Mother   . Hypertension Sister 7    Valve repaired    Social History Social History  Substance Use Topics  . Smoking status: Never Smoker    . Smokeless tobacco: Never Used  . Alcohol use Not on file     Comment: none in 2 years     Allergies   Flagyl [metronidazole]; Penicillins; Allopurinol; Amlodipine besy-benazepril hcl; Ancef [cefazolin]; Dexilant [dexlansoprazole]; and Vioxx [rofecoxib]   Review of Systems Review of Systems  Constitutional: Negative for activity change.  Respiratory: Negative for shortness of breath.   Cardiovascular: Negative for chest pain.  Gastrointestinal: Positive for abdominal pain.  All other systems reviewed and are negative.    Physical Exam Updated Vital Signs BP (!) 96/51 (BP Location: Left Arm)   Pulse 70   Temp 99.1 F (37.3 C) (Oral)   Resp 16   Ht 6' 2.5" (1.892 m)   Wt 214 lb (97.1 kg)   SpO2 94%   BMI 27.11 kg/m   Physical Exam  Constitutional: He appears well-developed and well-nourished.  HENT:  Head: Normocephalic and atraumatic.  Eyes: Conjunctivae are normal.  Neck: Neck supple.  Cardiovascular: Normal rate and regular rhythm.   No murmur heard. Pulmonary/Chest: Effort normal and breath sounds normal. No respiratory distress.  Abdominal: Soft. There is tenderness.  Tenderness to right upper quadrant.  Musculoskeletal: He exhibits no edema.  Neurological: He is alert.  Skin: Skin is warm and dry.  Psychiatric: He has a normal mood and affect.  Nursing note and vitals reviewed.    ED Treatments / Results  Labs (all labs ordered are listed, but only abnormal results are displayed) Labs Reviewed  COMPREHENSIVE METABOLIC PANEL - Abnormal; Notable for the following:       Result Value   Glucose, Bld 121 (*)    Creatinine, Ser 1.29 (*)    Total Bilirubin 1.4 (*)    GFR calc non Af Amer 55 (*)    All other components within normal limits  CBC - Abnormal; Notable for the following:    WBC 12.6 (*)    Platelets 143 (*)    All other components within normal limits  URINALYSIS, ROUTINE W REFLEX MICROSCOPIC (NOT AT [facility]) - Abnormal; Notable for the  following:    Color, Urine AMBER (*)    Specific Gravity, Urine >1.046 (*)    Bilirubin Urine SMALL (*)    Ketones, ur 15 (*)    All other components within normal limits  URINE CULTURE  LIPASE, BLOOD  BASIC METABOLIC PANEL  CBC  I-STAT TROPOININ, ED  I-STAT CG4 LACTIC ACID, ED  TYPE AND SCREEN  ABO/RH    EKG  EKG Interpretation None       Radiology No results found.  Procedures Procedures (including critical care time)  Medications Ordered in ED Medications  meropenem (MERREM) 2 g in sodium chloride 0.9 % 100 mL IVPB (2 g Intravenous New Bag/Given 09/28/15 1938)  carvedilol (COREG) tablet 12.5 mg (not administered)  losartan (COZAAR) tablet 50 mg (not administered)  famotidine (PEPCID) IVPB 20 mg premix (not administered)  enoxaparin (LOVENOX) injection 40 mg (not administered)  dextrose 5 % and 0.45 % NaCl with KCl 20 mEq/L infusion (not administered)  HYDROmorphone (DILAUDID) injection 1-2 mg (1 mg Intravenous Given 09/28/15 1951)  ondansetron (ZOFRAN-ODT) disintegrating tablet 4 mg (not administered)    Or  ondansetron (ZOFRAN) injection 4 mg (not administered)  acetaminophen (TYLENOL) tablet 650 mg (650 mg Oral Given 09/28/15 1808)  fentaNYL (SUBLIMAZE) injection 50 mcg (50 mcg Intravenous Given 09/28/15 1848)     Initial Impression / Assessment and Plan / ED Course  I have reviewed the triage vital signs and the nursing notes.  Pertinent labs & imaging results that were available during my care of the patient were reviewed by me and considered in my medical decision making (see chart for details).  Clinical Course    Patient is a 70 year old male with history of peritonitis presenting today with diverticulitis and abscess.  Ct shows: Perforated diverticulitis of the proximal small bowel with associated abscess measuring 3.9 cm.  Will get labs, call surgery, treat with abx. Patietn has multiple allergies.   9:22 PM Admitted to surgery.   Final Clinical  Impressions(s) / ED Diagnoses   Final diagnoses:  None    New Prescriptions Current Discharge Medication List       Aleigha Gilani Julio Alm, MD 09/28/15 2122

## 2015-09-28 NOTE — ED Triage Notes (Signed)
Per pt, Pt is coming from home with daughter. Pt reports having right upper sharp abdominal pain that started on Monday intermittently through today. Pt reports PCP sending pt to get CT with diagnosis of Diverticulitis with small bowel perforation.

## 2015-09-28 NOTE — Progress Notes (Signed)
Pharmacy Antibiotic Note  Jeffery Harvey is a 70 y.o. male presented on 09/28/2015 with intra-abdominal infection.  Pharmacy has been consulted for meropenem dosing. Tmax 100.7, WBC 12.6, CrCl ~ 63 ml/min  Plan: meropenem 2 g IV Q8H  Monitor renal fxn, c/s, clinical status, abx LOT  Height: 6' 2.5" (189.2 cm) Weight: 214 lb (97.1 kg) IBW/kg (Calculated) : 83.35  Temp (24hrs), Avg:100.7 F (38.2 C), Min:100.7 F (38.2 C), Max:100.7 F (38.2 C)   Recent Labs Lab 09/28/15 1631  WBC 12.6*  CREATININE 1.29*    Estimated Creatinine Clearance: 63.8 mL/min (by C-G formula based on SCr of 1.29 mg/dL).    Allergies  Allergen Reactions  . Allopurinol Swelling  . Amlodipine Besy-Benazepril Hcl     UNKNOWN  . Ancef [Cefazolin] Hives  . Dexilant [Dexlansoprazole] Nausea And Vomiting  . Flagyl [Metronidazole] Hives  . Penicillins Hives  . Vioxx [Rofecoxib] Swelling    Antimicrobials this admission: 8/9 meropenem >>   Microbiology results: 8/9 UCx:    Thank you for allowing pharmacy to be a part of this patient's care.  Gwenlyn Perking, PharmD PGY1 Pharmacy Resident Pager: [phone] 09/28/2015 6:48 PM

## 2015-09-29 ENCOUNTER — Inpatient Hospital Stay (HOSPITAL_COMMUNITY): Payer: PPO

## 2015-09-29 ENCOUNTER — Inpatient Hospital Stay (HOSPITAL_COMMUNITY): Payer: PPO | Admitting: Anesthesiology

## 2015-09-29 ENCOUNTER — Encounter (HOSPITAL_COMMUNITY): Admission: EM | Disposition: A | Discharge status: Home / Self Care | Payer: Self-pay | Source: Home / Self Care

## 2015-09-29 DIAGNOSIS — Z85828 Personal history of other malignant neoplasm of skin: Secondary | ICD-10-CM | POA: Diagnosis not present

## 2015-09-29 DIAGNOSIS — M1990 Unspecified osteoarthritis, unspecified site: Secondary | ICD-10-CM | POA: Diagnosis not present

## 2015-09-29 DIAGNOSIS — K5700 Diverticulitis of small intestine with perforation and abscess without bleeding: Secondary | ICD-10-CM | POA: Diagnosis not present

## 2015-09-29 DIAGNOSIS — Z9049 Acquired absence of other specified parts of digestive tract: Secondary | ICD-10-CM | POA: Diagnosis not present

## 2015-09-29 DIAGNOSIS — Z981 Arthrodesis status: Secondary | ICD-10-CM | POA: Diagnosis not present

## 2015-09-29 DIAGNOSIS — I10 Essential (primary) hypertension: Secondary | ICD-10-CM | POA: Diagnosis not present

## 2015-09-29 DIAGNOSIS — Z8249 Family history of ischemic heart disease and other diseases of the circulatory system: Secondary | ICD-10-CM | POA: Diagnosis not present

## 2015-09-29 DIAGNOSIS — Z4682 Encounter for fitting and adjustment of non-vascular catheter: Secondary | ICD-10-CM | POA: Diagnosis not present

## 2015-09-29 DIAGNOSIS — Z5331 Laparoscopic surgical procedure converted to open procedure: Secondary | ICD-10-CM | POA: Diagnosis not present

## 2015-09-29 DIAGNOSIS — K660 Peritoneal adhesions (postprocedural) (postinfection): Secondary | ICD-10-CM | POA: Diagnosis not present

## 2015-09-29 DIAGNOSIS — M109 Gout, unspecified: Secondary | ICD-10-CM | POA: Diagnosis not present

## 2015-09-29 DIAGNOSIS — Z6827 Body mass index (BMI) 27.0-27.9, adult: Secondary | ICD-10-CM | POA: Diagnosis not present

## 2015-09-29 DIAGNOSIS — M549 Dorsalgia, unspecified: Secondary | ICD-10-CM | POA: Diagnosis not present

## 2015-09-29 DIAGNOSIS — K5780 Diverticulitis of intestine, part unspecified, with perforation and abscess without bleeding: Secondary | ICD-10-CM | POA: Diagnosis not present

## 2015-09-29 DIAGNOSIS — Z79899 Other long term (current) drug therapy: Secondary | ICD-10-CM | POA: Diagnosis not present

## 2015-09-29 HISTORY — PX: BOWEL RESECTION: SHX1257

## 2015-09-29 HISTORY — PX: LAPAROSCOPY: SHX197

## 2015-09-29 HISTORY — PX: LAPAROTOMY: SHX154

## 2015-09-29 LAB — BASIC METABOLIC PANEL
ANION GAP: 7 (ref 5–15)
BUN: 17 mg/dL (ref 6–20)
CALCIUM: 8.3 mg/dL — AB (ref 8.9–10.3)
CO2: 26 mmol/L (ref 22–32)
Chloride: 103 mmol/L (ref 101–111)
Creatinine, Ser: 0.98 mg/dL (ref 0.61–1.24)
GFR calc non Af Amer: >60 mL/min (ref >60)
Glucose, Bld: 129 mg/dL — ABNORMAL HIGH (ref 65–99)
Potassium: 4 mmol/L (ref 3.5–5.1)
SODIUM: 136 mmol/L (ref 135–145)

## 2015-09-29 LAB — CBC
HCT: 35.8 % — ABNORMAL LOW (ref 39.0–52.0)
HEMOGLOBIN: 12 g/dL — AB (ref 13.0–17.0)
MCH: 30.3 pg (ref 26.0–34.0)
MCHC: 33.5 g/dL (ref 30.0–36.0)
MCV: 90.4 fL (ref 78.0–100.0)
Platelets: 108 10*3/uL — ABNORMAL LOW (ref 150–400)
RBC: 3.96 MIL/uL — AB (ref 4.22–5.81)
RDW: 12.3 % (ref 11.5–15.5)
WBC: 9.4 10*3/uL (ref 4.0–10.5)

## 2015-09-29 LAB — MRSA PCR SCREENING: MRSA BY PCR: NEGATIVE

## 2015-09-29 SURGERY — LAPAROTOMY, EXPLORATORY
Anesthesia: General | Site: Abdomen

## 2015-09-29 MED ORDER — MIDAZOLAM HCL 2 MG/2ML IJ SOLN
INTRAMUSCULAR | Status: AC
  Filled 2015-09-29: qty 2

## 2015-09-29 MED ORDER — ONDANSETRON HCL 4 MG/2ML IJ SOLN
4.0000 mg | Freq: Four times a day (QID) | INTRAMUSCULAR | Status: DC | PRN
  Administered 2015-10-01 – 2015-10-02 (×3): 4 mg via INTRAVENOUS
  Filled 2015-09-29: qty 2

## 2015-09-29 MED ORDER — ALBUMIN HUMAN 5 % IV SOLN
INTRAVENOUS | Status: DC | PRN
  Administered 2015-09-29 (×2): via INTRAVENOUS

## 2015-09-29 MED ORDER — ZOLPIDEM TARTRATE 5 MG PO TABS: 5.0000 mg | ORAL_TABLET | Freq: Every evening | ORAL | Status: DC | PRN

## 2015-09-29 MED ORDER — SUGAMMADEX SODIUM 500 MG/5ML IV SOLN: INTRAVENOUS | Status: DC | PRN

## 2015-09-29 MED ORDER — GLYCOPYRROLATE 0.2 MG/ML IJ SOLN
INTRAMUSCULAR | Status: DC | PRN
  Administered 2015-09-29: 0.2 mg via INTRAVENOUS

## 2015-09-29 MED ORDER — ONDANSETRON HCL 4 MG/2ML IJ SOLN
INTRAMUSCULAR | Status: DC | PRN
  Administered 2015-09-29: 4 mg via INTRAVENOUS

## 2015-09-29 MED ORDER — ROCURONIUM BROMIDE 10 MG/ML (PF) SYRINGE
PREFILLED_SYRINGE | INTRAVENOUS | Status: AC
  Filled 2015-09-29: qty 10

## 2015-09-29 MED ORDER — EVICEL 5 ML EX KIT
PACK | CUTANEOUS | Status: DC | PRN
  Administered 2015-09-29: 5 mL

## 2015-09-29 MED ORDER — SUGAMMADEX SODIUM 500 MG/5ML IV SOLN
INTRAVENOUS | Status: AC
  Filled 2015-09-29: qty 5

## 2015-09-29 MED ORDER — MORPHINE SULFATE 2 MG/ML IV SOLN
INTRAVENOUS | Status: DC
  Administered 2015-09-29: 29 mg via INTRAVENOUS
  Administered 2015-09-30: 15 mg via INTRAVENOUS
  Administered 2015-09-30: 4 mg via INTRAVENOUS
  Administered 2015-09-30: 13.5 mg via INTRAVENOUS
  Administered 2015-09-30: 7.5 mg via INTRAVENOUS
  Administered 2015-09-30: 10:00:00 via INTRAVENOUS
  Administered 2015-10-01: 1.5 mg via INTRAVENOUS
  Administered 2015-10-01 (×2): 3 mg via INTRAVENOUS
  Administered 2015-10-01: 1 mg via INTRAVENOUS
  Administered 2015-10-01: 0 mg via INTRAVENOUS
  Administered 2015-10-01: 4.5 mg via INTRAVENOUS
  Administered 2015-10-02: 3 mg via INTRAVENOUS
  Administered 2015-10-02: 21:00:00 via INTRAVENOUS
  Administered 2015-10-02: 3 mg via INTRAVENOUS
  Administered 2015-10-02 (×2): 0 mg via INTRAVENOUS
  Administered 2015-10-02: 4 mg via INTRAVENOUS
  Administered 2015-10-03: 1.5 mg via INTRAVENOUS
  Administered 2015-10-03 (×2): 0 mg via INTRAVENOUS
  Filled 2015-09-29 (×3): qty 25

## 2015-09-29 MED ORDER — LIDOCAINE 2% (20 MG/ML) 5 ML SYRINGE
INTRAMUSCULAR | Status: DC | PRN
  Administered 2015-09-29: 60 mg via INTRAVENOUS

## 2015-09-29 MED ORDER — BUPIVACAINE-EPINEPHRINE 0.25% -1:200000 IJ SOLN
INTRAMUSCULAR | Status: DC | PRN
  Administered 2015-09-29: 6 mL

## 2015-09-29 MED ORDER — BUPIVACAINE-EPINEPHRINE (PF) 0.25% -1:200000 IJ SOLN
INTRAMUSCULAR | Status: AC
  Filled 2015-09-29: qty 30

## 2015-09-29 MED ORDER — FENTANYL CITRATE (PF) 250 MCG/5ML IJ SOLN
INTRAMUSCULAR | Status: AC
  Filled 2015-09-29: qty 5

## 2015-09-29 MED ORDER — SUGAMMADEX SODIUM 200 MG/2ML IV SOLN
INTRAVENOUS | Status: AC
  Filled 2015-09-29: qty 2

## 2015-09-29 MED ORDER — PROPOFOL 10 MG/ML IV BOLUS
INTRAVENOUS | Status: AC
  Filled 2015-09-29: qty 20

## 2015-09-29 MED ORDER — DEXAMETHASONE SODIUM PHOSPHATE 10 MG/ML IJ SOLN
INTRAMUSCULAR | Status: DC | PRN
  Administered 2015-09-29: 10 mg via INTRAVENOUS

## 2015-09-29 MED ORDER — ONDANSETRON HCL 4 MG/2ML IJ SOLN
INTRAMUSCULAR | Status: AC
  Filled 2015-09-29: qty 2

## 2015-09-29 MED ORDER — ROCURONIUM BROMIDE 10 MG/ML (PF) SYRINGE
PREFILLED_SYRINGE | INTRAVENOUS | Status: DC | PRN
  Administered 2015-09-29: 100 mg via INTRAVENOUS
  Administered 2015-09-29: 10 mg via INTRAVENOUS

## 2015-09-29 MED ORDER — LACTATED RINGERS IV SOLN
INTRAVENOUS | Status: DC | PRN
  Administered 2015-09-29 (×2): via INTRAVENOUS

## 2015-09-29 MED ORDER — DIPHENHYDRAMINE HCL 12.5 MG/5ML PO ELIX: 12.5000 mg | ORAL_SOLUTION | Freq: Four times a day (QID) | ORAL | Status: DC | PRN

## 2015-09-29 MED ORDER — MORPHINE SULFATE 2 MG/ML IV SOLN
INTRAVENOUS | Status: DC
  Administered 2015-09-29: 16:00:00 via INTRAVENOUS

## 2015-09-29 MED ORDER — NALOXONE HCL 0.4 MG/ML IJ SOLN: 0.4000 mg | INTRAMUSCULAR | Status: DC | PRN

## 2015-09-29 MED ORDER — ENOXAPARIN SODIUM 40 MG/0.4ML ~~LOC~~ SOLN
40.0000 mg | SUBCUTANEOUS | Status: DC
  Administered 2015-09-30 – 2015-10-05 (×6): 40 mg via SUBCUTANEOUS
  Filled 2015-09-29 (×6): qty 0.4

## 2015-09-29 MED ORDER — PROPOFOL 10 MG/ML IV BOLUS
INTRAVENOUS | Status: DC | PRN
  Administered 2015-09-29: 130 mg via INTRAVENOUS

## 2015-09-29 MED ORDER — SODIUM CHLORIDE 0.9% FLUSH: 9.0000 mL | INTRAVENOUS | Status: DC | PRN

## 2015-09-29 MED ORDER — LACTATED RINGERS IV SOLN
INTRAVENOUS | Status: DC
  Administered 2015-09-29 (×2): via INTRAVENOUS

## 2015-09-29 MED ORDER — DIPHENHYDRAMINE HCL 50 MG/ML IJ SOLN
12.5000 mg | Freq: Four times a day (QID) | INTRAMUSCULAR | Status: DC | PRN
  Administered 2015-10-02: 12.5 mg via INTRAVENOUS

## 2015-09-29 MED ORDER — PHENYLEPHRINE HCL 10 MG/ML IJ SOLN
INTRAMUSCULAR | Status: DC | PRN
  Administered 2015-09-29 (×2): 200 ug via INTRAVENOUS
  Administered 2015-09-29: 120 ug via INTRAVENOUS
  Administered 2015-09-29 (×2): 200 ug via INTRAVENOUS
  Administered 2015-09-29: 80 ug via INTRAVENOUS

## 2015-09-29 MED ORDER — MIDAZOLAM HCL 5 MG/5ML IJ SOLN
INTRAMUSCULAR | Status: DC | PRN
  Administered 2015-09-29 (×2): 1 mg via INTRAVENOUS

## 2015-09-29 MED ORDER — SUCCINYLCHOLINE CHLORIDE 200 MG/10ML IV SOSY
PREFILLED_SYRINGE | INTRAVENOUS | Status: AC
  Filled 2015-09-29: qty 10

## 2015-09-29 MED ORDER — ZOLPIDEM TARTRATE 5 MG PO TABS
10.0000 mg | ORAL_TABLET | Freq: Every evening | ORAL | Status: DC | PRN
  Administered 2015-09-29 – 2015-10-04 (×6): 10 mg via ORAL
  Filled 2015-09-29 (×6): qty 2

## 2015-09-29 MED ORDER — ACETAMINOPHEN 10 MG/ML IV SOLN
1000.0000 mg | Freq: Four times a day (QID) | INTRAVENOUS | Status: AC
  Administered 2015-09-29 – 2015-09-30 (×4): 1000 mg via INTRAVENOUS
  Filled 2015-09-29 (×4): qty 100

## 2015-09-29 MED ORDER — 0.9 % SODIUM CHLORIDE (POUR BTL) OPTIME
TOPICAL | Status: DC | PRN
  Administered 2015-09-29 (×6): 1000 mL

## 2015-09-29 MED ORDER — EPHEDRINE SULFATE 50 MG/ML IJ SOLN
INTRAMUSCULAR | Status: DC | PRN
  Administered 2015-09-29 (×4): 25 mg via INTRAVENOUS
  Administered 2015-09-29: 10 mg via INTRAVENOUS

## 2015-09-29 MED ORDER — DEXAMETHASONE SODIUM PHOSPHATE 10 MG/ML IJ SOLN
INTRAMUSCULAR | Status: AC
  Filled 2015-09-29: qty 1

## 2015-09-29 MED ORDER — HYDROMORPHONE HCL 1 MG/ML IJ SOLN: 0.2500 mg | INTRAMUSCULAR | Status: DC | PRN

## 2015-09-29 MED ORDER — DIPHENHYDRAMINE HCL 50 MG/ML IJ SOLN
12.5000 mg | Freq: Four times a day (QID) | INTRAMUSCULAR | Status: DC | PRN
  Filled 2015-09-29: qty 1

## 2015-09-29 MED ORDER — EVICEL 5 ML EX KIT
PACK | CUTANEOUS | Status: AC
  Filled 2015-09-29: qty 1

## 2015-09-29 MED ORDER — ONDANSETRON HCL 4 MG/2ML IJ SOLN
4.0000 mg | Freq: Four times a day (QID) | INTRAMUSCULAR | Status: DC | PRN
  Filled 2015-09-29 (×2): qty 2

## 2015-09-29 MED ORDER — FENTANYL CITRATE (PF) 250 MCG/5ML IJ SOLN
INTRAMUSCULAR | Status: DC | PRN
  Administered 2015-09-29: 150 ug via INTRAVENOUS
  Administered 2015-09-29 (×3): 50 ug via INTRAVENOUS

## 2015-09-29 MED ORDER — MORPHINE SULFATE 2 MG/ML IV SOLN
INTRAVENOUS | Status: AC
  Filled 2015-09-29: qty 25

## 2015-09-29 MED ORDER — SUGAMMADEX SODIUM 200 MG/2ML IV SOLN
INTRAVENOUS | Status: DC | PRN
  Administered 2015-09-29: 200 mg via INTRAVENOUS

## 2015-09-29 MED ORDER — PHENYLEPHRINE HCL 10 MG/ML IJ SOLN
INTRAVENOUS | Status: DC | PRN
  Administered 2015-09-29: 20 ug/min via INTRAVENOUS

## 2015-09-29 MED ORDER — PROMETHAZINE HCL 25 MG/ML IJ SOLN: 12.5000 mg | Freq: Four times a day (QID) | INTRAMUSCULAR | Status: DC | PRN

## 2015-09-29 SURGICAL SUPPLY — 65 items
BLADE SURG ROTATE 9660 (MISCELLANEOUS) IMPLANT
CANISTER SUCTION 2500CC (MISCELLANEOUS) ×3 IMPLANT
CHLORAPREP W/TINT 26ML (MISCELLANEOUS) ×3 IMPLANT
COVER SURGICAL LIGHT HANDLE (MISCELLANEOUS) ×6 IMPLANT
DECANTER SPIKE VIAL GLASS SM (MISCELLANEOUS) ×6 IMPLANT
DRAPE LAPAROSCOPIC ABDOMINAL (DRAPES) ×3 IMPLANT
DRAPE WARM FLUID 44X44 (DRAPE) ×6 IMPLANT
DRSG OPSITE POSTOP 4X10 (GAUZE/BANDAGES/DRESSINGS) IMPLANT
DRSG OPSITE POSTOP 4X8 (GAUZE/BANDAGES/DRESSINGS) IMPLANT
DRSG PAD ABDOMINAL 8X10 ST (GAUZE/BANDAGES/DRESSINGS) ×3 IMPLANT
DRSG TEGADERM 2-3/8X2-3/4 SM (GAUZE/BANDAGES/DRESSINGS) ×3 IMPLANT
ELECT BLADE 6.5 EXT (BLADE) IMPLANT
ELECT CAUTERY BLADE 6.4 (BLADE) ×6 IMPLANT
ELECT REM PT RETURN 9FT ADLT (ELECTROSURGICAL) ×3
ELECTRODE REM PT RTRN 9FT ADLT (ELECTROSURGICAL) ×2 IMPLANT
GAUZE SPONGE 2X2 8PLY STRL LF (GAUZE/BANDAGES/DRESSINGS) ×2 IMPLANT
GLOVE BIO SURGEON STRL SZ7.5 (GLOVE) ×3 IMPLANT
GLOVE BIOGEL M STRL SZ7.5 (GLOVE) ×6 IMPLANT
GLOVE BIOGEL PI IND STRL 7.0 (GLOVE) ×6 IMPLANT
GLOVE BIOGEL PI IND STRL 8 (GLOVE) ×2 IMPLANT
GLOVE BIOGEL PI INDICATOR 7.0 (GLOVE) ×3
GLOVE BIOGEL PI INDICATOR 8 (GLOVE) ×1
GLOVE ECLIPSE 7.0 STRL STRAW (GLOVE) ×6 IMPLANT
GLOVE SURG SS PI 7.5 STRL IVOR (GLOVE) ×3 IMPLANT
GOWN STRL REUS W/ TWL LRG LVL3 (GOWN DISPOSABLE) ×6 IMPLANT
GOWN STRL REUS W/ TWL XL LVL3 (GOWN DISPOSABLE) ×2 IMPLANT
GOWN STRL REUS W/TWL LRG LVL3 (GOWN DISPOSABLE) ×3
GOWN STRL REUS W/TWL XL LVL3 (GOWN DISPOSABLE) ×1
KIT BASIN OR (CUSTOM PROCEDURE TRAY) ×3 IMPLANT
KIT ROOM TURNOVER OR (KITS) ×3 IMPLANT
LIGASURE IMPACT 36 18CM CVD LR (INSTRUMENTS) ×3 IMPLANT
LIQUID BAND (GAUZE/BANDAGES/DRESSINGS) ×3 IMPLANT
NS IRRIG 1000ML POUR BTL (IV SOLUTION) ×6 IMPLANT
PACK GENERAL/GYN (CUSTOM PROCEDURE TRAY) ×3 IMPLANT
PAD ARMBOARD 7.5X6 YLW CONV (MISCELLANEOUS) ×6 IMPLANT
RELOAD PROXIMATE 75MM BLUE (ENDOMECHANICALS) ×6 IMPLANT
SCALPEL HARMONIC ACE (MISCELLANEOUS) IMPLANT
SCISSORS LAP 5X35 DISP (ENDOMECHANICALS) IMPLANT
SET IRRIG TUBING LAPAROSCOPIC (IRRIGATION / IRRIGATOR) IMPLANT
SLEEVE ENDOPATH XCEL 5M (ENDOMECHANICALS) ×3 IMPLANT
SPECIMEN JAR LARGE (MISCELLANEOUS) IMPLANT
SPONGE GAUZE 2X2 STER 10/PKG (GAUZE/BANDAGES/DRESSINGS) ×1
SPONGE GAUZE 4X4 12PLY STER LF (GAUZE/BANDAGES/DRESSINGS) ×3 IMPLANT
SPONGE LAP 18X18 X RAY DECT (DISPOSABLE) IMPLANT
STAPLER PROXIMATE 75MM BLUE (STAPLE) ×3 IMPLANT
STAPLER VISISTAT 35W (STAPLE) ×3 IMPLANT
SUCTION POOLE TIP (SUCTIONS) ×3 IMPLANT
SUT MNCRL AB 4-0 PS2 18 (SUTURE) ×3 IMPLANT
SUT PDS AB 1 TP1 96 (SUTURE) ×6 IMPLANT
SUT SILK 2 0 SH CR/8 (SUTURE) ×3 IMPLANT
SUT SILK 2 0 TIES 10X30 (SUTURE) ×3 IMPLANT
SUT SILK 3 0 SH CR/8 (SUTURE) ×6 IMPLANT
SUT SILK 3 0 TIES 10X30 (SUTURE) ×3 IMPLANT
SUT VIC AB 3-0 SH 18 (SUTURE) IMPLANT
SYRINGE TOOMEY DISP (SYRINGE) ×3 IMPLANT
TOWEL OR 17X24 6PK STRL BLUE (TOWEL DISPOSABLE) ×3 IMPLANT
TOWEL OR 17X26 10 PK STRL BLUE (TOWEL DISPOSABLE) ×3 IMPLANT
TRAY FOLEY CATH 16FRSI W/METER (SET/KITS/TRAYS/PACK) IMPLANT
TRAY LAPAROSCOPIC MC (CUSTOM PROCEDURE TRAY) ×3 IMPLANT
TROCAR XCEL BLUNT TIP 100MML (ENDOMECHANICALS) IMPLANT
TROCAR XCEL NON-BLD 11X100MML (ENDOMECHANICALS) IMPLANT
TROCAR XCEL NON-BLD 5MMX100MML (ENDOMECHANICALS) ×3 IMPLANT
TUBING INSUFFLATION (TUBING) ×3 IMPLANT
WATER STERILE IRR 1000ML POUR (IV SOLUTION) IMPLANT
YANKAUER SUCT BULB TIP NO VENT (SUCTIONS) ×3 IMPLANT

## 2015-09-29 NOTE — Transfer of Care (Signed)
Immediate Anesthesia Transfer of Care Note  Patient: Jeffery Harvey  Procedure(s) Performed: Procedure(s): EXPLORATORY LAPAROTOMY (N/A) LAPAROSCOPY DIAGNOSTIC (N/A) SMALL BOWEL RESECTION (N/A)  Patient Location: PACU  Anesthesia Type:General  Level of Consciousness: lethargic and responds to stimulation  Airway & Oxygen Therapy: Patient Spontanous Breathing and non-rebreather face mask  Post-op Assessment: Report given to RN  Post vital signs: Reviewed  Last Vitals:  Vitals:   09/29/15 1000 09/29/15 1004  BP: (!) 105/50 (!) 107/57  Pulse: 65 65  Resp:    Temp:      Last Pain:  Vitals:   09/29/15 1026  TempSrc:   PainSc: 2       Patients Stated Pain Goal: 2 ([id number])  Complications: No apparent anesthesia complications

## 2015-09-29 NOTE — Anesthesia Procedure Notes (Signed)
Procedure Name: Intubation Date/Time: 09/29/2015 12:24 PM Performed by: Mervyn Gay Pre-anesthesia Checklist: Patient identified, Patient being monitored, Timeout performed, Emergency Drugs available and Suction available Patient Re-evaluated:Patient Re-evaluated prior to inductionOxygen Delivery Method: Circle System Utilized Preoxygenation: Pre-oxygenation with 100% oxygen Intubation Type: IV induction Ventilation: Mask ventilation without difficulty Laryngoscope Size: Mac, 4 and Glidescope Grade View: Grade I Tube type: Oral Tube size: 8.0 mm Number of attempts: 1 Airway Equipment and Method: Stylet and Video-laryngoscopy Placement Confirmation: ETT inserted through vocal cords under direct vision,  positive ETCO2 and breath sounds checked- equal and bilateral Secured at: 23 cm Tube secured with: Tape Dental Injury: Teeth and Oropharynx as per pre-operative assessment  Difficulty Due To: Difficult Airway- due to reduced neck mobility

## 2015-09-29 NOTE — Progress Notes (Signed)
Report called to Short Stay. 1100 antibiotic sent in chart with pt. To Short Stay.

## 2015-09-29 NOTE — Anesthesia Preprocedure Evaluation (Addendum)
Anesthesia Evaluation  Patient identified by MRN, date of birth, ID band Patient awake    Reviewed: Allergy & Precautions, NPO status , Patient's Chart, lab work & pertinent test results  History of Anesthesia Complications (+) PONV and history of anesthetic complications  Airway Mallampati: II  TM Distance: >3 FB Neck ROM: Full    Dental no notable dental hx. (+) Dental Advisory Given   Pulmonary neg pulmonary ROS,    Pulmonary exam normal breath sounds clear to auscultation       Cardiovascular hypertension, Pt. on medications and Pt. on home beta blockers Normal cardiovascular exam Rhythm:Regular Rate:Normal     Neuro/Psych negative neurological ROS  negative psych ROS   GI/Hepatic negative GI ROS, Neg liver ROS,   Endo/Other  negative endocrine ROS  Renal/GU negative Renal ROS  negative genitourinary   Musculoskeletal  (+) Arthritis , Osteoarthritis,    Abdominal   Peds negative pediatric ROS (+)  Hematology negative hematology ROS (+)   Anesthesia Other Findings   Reproductive/Obstetrics negative OB ROS                             Anesthesia Physical  Anesthesia Plan  ASA: II  Anesthesia Plan: General   Post-op Pain Management:    Induction: Intravenous, Rapid sequence and Cricoid pressure planned  Airway Management Planned: Oral ETT and Video Laryngoscope Planned  Additional Equipment:   Intra-op Plan: Utilization Of Total Body Hypothermia per surgeon request  Post-operative Plan: Extubation in OR  Informed Consent: I have reviewed the patients History and Physical, chart, labs and discussed the procedure including the risks, benefits and alternatives for the proposed anesthesia with the patient or authorized representative who has indicated his/her understanding and acceptance.   Dental Advisory Given and Dental advisory given  Plan Discussed with: CRNA and  Surgeon  Anesthesia Plan Comments: (  Discussed general anesthesia, including possible nausea, instrumentation of airway, sore throat,pulmonary aspiration, etc. I asked if the were any outstanding questions, or  concerns before we proceeded. )       Anesthesia Quick Evaluation

## 2015-09-29 NOTE — OR Nursing (Signed)
Upon arrival to PACU pt O2sats decreased to 21%, assisted with jaw thrust and ventilation. Noted pt NG tube coiled up in back of throat. Pt placed on NRB mask and O2sat now 99%. NG tube was adjusted and is now in correct position. Placement verified with auscultation and portable abd xray ordered.

## 2015-09-29 NOTE — Op Note (Signed)
09/28/2015 - 09/29/2015  2:39 PM  PATIENT:  Jeffery Harvey  70 y.o. male  PRE-OPERATIVE DIAGNOSIS:  PERFORATED SMALL BOWEL DIVERTICULITIS WITH ABSCESS  POST-OPERATIVE DIAGNOSIS:  Perforated necrotic jejunal diverticulitis  PROCEDURE:  Procedure(s): LAPAROSCOPY DIAGNOSTIC converted to EXPLORATORY LAPAROTOMY SMALL BOWEL RESECTION  SURGEON:  Surgeon(s): Greer Pickerel, MD  ASSISTANTS: none   ANESTHESIA:   general  DRAINS: Nasogastric Tube and Urinary Catheter (Foley)   LOCAL MEDICATIONS USED:  MARCAINE     SPECIMEN:  Source of Specimen:  Proximal jejunal perforated diverticulitis  DISPOSITION OF SPECIMEN:  PATHOLOGY  COUNTS:  YES  INDICATION FOR PROCEDURE: 69 year old Caucasian male with a history of medically managed jejunal diverticulitis at outside hospital in spring of 2016 presents with recurring abdominal pain. He went to his primary care physician's office who sent him for CT scan which demonstrated proximal small bowel diverticulitis with perforation and abscess. He was sent to the emergency room and admitted. I recommended resection given the evidence of the abscess and perforation. I did not feel this would resolve with medical management. We discussed at length the risk and benefits of the procedure including but not limited to bleeding, infection, injury to surrounding structures, conversion to open, anastomotic stricture, anastomotic leak, ileus, blood clot, incisional hernia, perioperative cardiac and pulmonary events. We discussed the typical recovery course.  PROCEDURE: After obtaining informed consent he was taken to operating room one at [facility] and placed upon on the operating room table. General endotracheal anesthesia was established. Sequential compression devices were placed. A Foley catheter was placed. A nasogastric tube was placed. His abdomen was prepped and draped in the usual standard surgical fashion. He had received meropenem prior to skin  incision. A surgical timeout was performed. A small supraumbilical incision was made with a #11 blade after local had been infiltrated. The fascia was grasped and lifted anteriorly. The fascia was incised and a pursestring suture was placed around the fascial edges consisting of 0 Vicryl. A 12 mm Hassan trocar was placed and pneumoperitoneum was smoothly established up to a patient pressure of 15 mmHg. Two 5 mm trochars were placed in the right mid abdomen under direct visualization after local had been infiltrated. The omentum was lifted up over the colon to the upper abdomen. There were a few omental attachments the left lateral abdominal wall which were taken down with EndoShears with electrocautery. The small bowel is identified. Identified the proximal jejunum and started running it distally with atraumatic bowel graspers. The left upper quadrant the small bowel was fixed and I could not mobilize it any further. It felt to be a hard mass. At this point I decided to convert to an open procedure as I felt proceeding laparoscopically would be ill advised. The [facility] trocar was removed. I then extended the supraumbilical incision up to the xiphoid using a 10 blade. The patient had very little subcutaneous tissue. The fascia was incised with electrocautery. His fascia was attenuated. The abdominal cavity was entered. A Balfour retractor was placed. I palpated a hard lump of small bowel in the left upper quadrant. I identified the ligament of Treitz and started running the jejunum distally. I was able to slightly finger fracture and mobilize the jejunum from this area. Some interloop adhesions were taken down with Metzenbaum scissors. I ended up mobilizing and freeing the area of concern. It was about 45 cm from the ligament of Treitz. It was hard indurated small section of jejunal with necrotic diverticulum that had evidence of  perforation. There is no purulence within the abdominal cavity. There are no enteric  contents with the abdominal cavity. The area perforated jejunal diverticulitis had been tethered to the root of the transverse colon mesentery. There is no evidence of devascularization to the transverse colon mesentery. I was then able to run the remaining part of the small bowel all the way down to the terminal ileum. There is no further evidence of small bowel diverticulum. I then reexamined the small bowel from the ligament of Treitz all the way down to the TI again and there is no evidence of serosal tear or enterotomy. He just had that one focal section of jejunal diverticulitis that had some necrosis and perforation.  I decided to resect that section of small bowel. A small rent was made in the mesentery just next to bowel lumen a few inches above the area of perforation. It was then divided with a single firing of the GIA-75 stapler with a blue load. I then made a similar rent in the mesentery just distal to the area of perforation and transected it with another firing of the GIA-75 stapler with a blue load. The mesentery was then taken down sequentially with LigaSure. There was a little bit of bleeding from the mesenteric edges cyst is taking care of a several figure-of-eight 2-0 silk sutures. The specimen was passed off the field. I then irrigated the left upper quadrant with about 3 L of saline prior to doing my anastomosis. The small bowel was then aligned in a side-to-side fashion to create a side-to-side anastomosis. Enterotomies were made in the proximal small bowel in the distal small bowel just next staple line with electrocautery. One limb of the GIA-75 stapler with a blue load was then placed through each enterotomy. The stapler was brought together and fired to create a common channel. The anastomosis is widely patent with no evidence of bleeding. Because the bowel in this area was chronically thickened compared to the distal small bowel I elected to close the common closure with interrupted  3-0 silk sutures. A 3-0 silk suture was placed in the crotch of the anastomosis. I then closed the mesenteric defect with interrupted figure-of-eight 2-0 silk sutures. We then irrigated the abdomen again with 2 L of saline. We then changed gown and gloves and placed new drapes. Eviceall tissue sealant was placed over the anastomosis. The omentum was placed over the small bowel. We then closed the fascia with a running #1 looped PDS 1 from above and one from below. I elected leave the skin open. I reestablished pneumoperitoneum and the right abdominal wall trocar and inspected my closure. There is no evidence of air leak. There is nothing trapped in our closure. Pneumoperitoneum was released and the remaining trochars were removed. The  Two 5 mm trocar sites were closed with skin staples. A moist gauze was placed in the midline followed by dry gauze and tape. All needle, instrument, and sponge counts were correct 2. The patient was extubated and taken to the recovery room in stable condition. There are no immediate complications.  PLAN OF CARE: PACU  PATIENT DISPOSITION:  PACU - hemodynamically stable.   Delay start of Pharmacological VTE agent (>24hrs) due to surgical blood loss or risk of bleeding:  no  [name]. [name], MD, FACS General, Bariatric, & Minimally Invasive Surgery [facility], [place]

## 2015-09-30 ENCOUNTER — Encounter (HOSPITAL_COMMUNITY): Payer: Self-pay | Admitting: General Surgery

## 2015-09-30 DIAGNOSIS — K5700 Diverticulitis of small intestine with perforation and abscess without bleeding: Secondary | ICD-10-CM | POA: Diagnosis present

## 2015-09-30 LAB — BASIC METABOLIC PANEL
ANION GAP: 6 (ref 5–15)
BUN: 8 mg/dL (ref 6–20)
CHLORIDE: 102 mmol/L (ref 101–111)
CO2: 27 mmol/L (ref 22–32)
Calcium: 8.6 mg/dL — ABNORMAL LOW (ref 8.9–10.3)
Creatinine, Ser: 0.77 mg/dL (ref 0.61–1.24)
GFR calc non Af Amer: >60 mL/min (ref >60)
Glucose, Bld: 179 mg/dL — ABNORMAL HIGH (ref 65–99)
Potassium: 4.9 mmol/L (ref 3.5–5.1)
Sodium: 135 mmol/L (ref 135–145)

## 2015-09-30 LAB — URINE CULTURE: Culture: NO GROWTH

## 2015-09-30 LAB — CBC
HEMATOCRIT: 37.7 % — AB (ref 39.0–52.0)
HEMOGLOBIN: 12.5 g/dL — AB (ref 13.0–17.0)
MCH: 29.7 pg (ref 26.0–34.0)
MCHC: 33.2 g/dL (ref 30.0–36.0)
MCV: 89.5 fL (ref 78.0–100.0)
Platelets: 116 10*3/uL — ABNORMAL LOW (ref 150–400)
RBC: 4.21 MIL/uL — ABNORMAL LOW (ref 4.22–5.81)
RDW: 12.3 % (ref 11.5–15.5)
WBC: 11.1 10*3/uL — AB (ref 4.0–10.5)

## 2015-09-30 MED ORDER — DULOXETINE HCL 60 MG PO CPEP
60.0000 mg | ORAL_CAPSULE | Freq: Every day | ORAL | Status: DC
  Administered 2015-09-30 – 2015-10-05 (×6): 60 mg via ORAL
  Filled 2015-09-30 (×6): qty 1

## 2015-09-30 MED ORDER — PHENOL 1.4 % MT LIQD
1.0000 | OROMUCOSAL | Status: DC | PRN
  Administered 2015-09-30: 1 via OROMUCOSAL
  Filled 2015-09-30: qty 177

## 2015-09-30 MED ORDER — TAMSULOSIN HCL 0.4 MG PO CAPS
0.4000 mg | ORAL_CAPSULE | Freq: Every day | ORAL | Status: DC
  Administered 2015-09-30 – 2015-10-04 (×4): 0.4 mg via ORAL
  Filled 2015-09-30 (×5): qty 1

## 2015-09-30 NOTE — Progress Notes (Signed)
1 Day Post-Op  Subjective: Comfortable Foley just removed  Objective: Vital signs in last 24 hours: Temp:  [97.5 F (36.4 C)-98.4 F (36.9 C)] 98.2 F (36.8 C) (08/11 0537) Pulse Rate:  [65-82] 68 (08/11 0537) Resp:  [11-16] 11 (08/11 0738) BP: (104-166)/(50-83) 110/63 (08/11 0537) SpO2:  [26 %-98 %] 98 % (08/11 0738) FiO2 (%):  [29 %] 29 % (08/10 2316) Last BM Date: 09/28/15  Intake/Output from previous day: 08/10 0701 - 08/11 0700 In: 5000 [I.V.:4300; IV Piggyback:700] Out: 2425 [Urine:2275; Emesis/NG output:100; Blood:50] Intake/Output this shift: No intake/output data recorded.  Comfortable in appearance Lungs clear Abdomen soft Lab Results:   Recent Labs  09/29/15 0520 09/30/15 0317  WBC 9.4 11.1*  HGB 12.0* 12.5*  HCT 35.8* 37.7*  PLT 108* 116*   BMET  Recent Labs  09/29/15 0520 09/30/15 0317  NA 136 135  K 4.0 4.9  CL 103 102  CO2 26 27  GLUCOSE 129* 179*  BUN 17 8  CREATININE 0.98 0.77  CALCIUM 8.3* 8.6*   PT/INR No results for input(s): LABPROT, INR in the last 72 hours. ABG No results for input(s): PHART, HCO3 in the last 72 hours.  Invalid input(s): PCO2, PO2  Studies/Results: Dg Abd Portable 1v  Result Date: 09/29/2015 CLINICAL DATA:  70 year old male undergoing nasogastric tube placement EXAM: PORTABLE ABDOMEN - 1 VIEW COMPARISON:  Prior abdominal radiographs 08/03/2014 FINDINGS: The tip of the nasogastric tube projects over the region of the gastroesophageal junction. The bowel gas pattern is not obstructed. Residual oral contrast material is present in the colon. No acute osseous abnormality. IMPRESSION: The tip of the nasogastric tube overlies the gastroesophageal junction. Recommend advancing for more optimal position within the gastric lumen. Electronically Signed   By: Jacqulynn Cadet M.D.   On: 09/29/2015 15:56    Anti-infectives: Anti-infectives    Start     Dose/Rate Route Frequency Ordered Stop   09/28/15 1900  meropenem  (MERREM) 2 g in sodium chloride 0.9 % 100 mL IVPB  Status:  Discontinued     2 g 200 mL/hr over 30 Minutes Intravenous Every 8 hours 09/28/15 1833 09/29/15 1706      Assessment/Plan: s/p Procedure(s): EXPLORATORY LAPAROTOMY (N/A) LAPAROSCOPY DIAGNOSTIC (N/A) SMALL BOWEL RESECTION (N/A)  D/c foley Continue NPO and NG   LOS: 2 days    Stashia Sia A 09/30/2015

## 2015-09-30 NOTE — Anesthesia Postprocedure Evaluation (Signed)
Anesthesia Post Note  Patient: Jeffery Harvey  Procedure(s) Performed: Procedure(s) (LRB): EXPLORATORY LAPAROTOMY (N/A) LAPAROSCOPY DIAGNOSTIC (N/A) SMALL BOWEL RESECTION (N/A)  Patient location during evaluation: PACU Anesthesia Type: General Level of consciousness: awake Pain management: pain level controlled Vital Signs Assessment: post-procedure vital signs reviewed and stable Respiratory status: spontaneous breathing Cardiovascular status: stable Postop Assessment: no signs of nausea or vomiting Anesthetic complications: no    Last Vitals:  Vitals:   09/30/15 0738 09/30/15 1008  BP:  (!) 105/59  Pulse:  60  Resp: 11 12  Temp:  36.4 C    Last Pain:  Vitals:   09/30/15 1008  TempSrc: Oral  PainSc:                  Bradie Lacock

## 2015-10-01 LAB — CBC
HEMATOCRIT: 36.1 % — AB (ref 39.0–52.0)
HEMOGLOBIN: 11.7 g/dL — AB (ref 13.0–17.0)
MCH: 29.2 pg (ref 26.0–34.0)
MCHC: 32.4 g/dL (ref 30.0–36.0)
MCV: 90 fL (ref 78.0–100.0)
Platelets: 128 10*3/uL — ABNORMAL LOW (ref 150–400)
RBC: 4.01 MIL/uL — ABNORMAL LOW (ref 4.22–5.81)
RDW: 12.2 % (ref 11.5–15.5)
WBC: 9.6 10*3/uL (ref 4.0–10.5)

## 2015-10-01 LAB — BASIC METABOLIC PANEL
Anion gap: 7 (ref 5–15)
BUN: 7 mg/dL (ref 6–20)
CHLORIDE: 102 mmol/L (ref 101–111)
CO2: 29 mmol/L (ref 22–32)
CREATININE: 0.69 mg/dL (ref 0.61–1.24)
Calcium: 9.2 mg/dL (ref 8.9–10.3)
GFR calc non Af Amer: >60 mL/min (ref >60)
Glucose, Bld: 141 mg/dL — ABNORMAL HIGH (ref 65–99)
Potassium: 5.1 mmol/L (ref 3.5–5.1)
Sodium: 138 mmol/L (ref 135–145)

## 2015-10-01 NOTE — Progress Notes (Signed)
2 Days Post-Op  Subjective: Comfortable except for NG No flatus yet  Objective: Vital signs in last 24 hours: Temp:  [98 F (36.7 C)-98.6 F (37 C)] 98.6 F (37 C) (08/12 0524) Pulse Rate:  [60-62] 60 (08/12 0524) Resp:  [15-19] 15 (08/12 0800) BP: (106-123)/(62-70) 123/69 (08/12 0524) SpO2:  [94 %-98 %] 96 % (08/12 0800) Last BM Date: 09/28/15  Intake/Output from previous day: 08/11 0701 - 08/12 0700 In: 3005 [P.O.:30; I.V.:2875; IV Piggyback:100] Out: 2100 [Urine:1900; Emesis/NG output:200] Intake/Output this shift: Total I/O In: -  Out: 550 [Urine:550]  Exam: Abdomen distended, minimally tender, quiet  Lab Results:   Recent Labs  09/30/15 0317 10/01/15 0540  WBC 11.1* 9.6  HGB 12.5* 11.7*  HCT 37.7* 36.1*  PLT 116* 128*   BMET  Recent Labs  09/30/15 0317 10/01/15 0540  NA 135 138  K 4.9 5.1  CL 102 102  CO2 27 29  GLUCOSE 179* 141*  BUN 8 7  CREATININE 0.77 0.69  CALCIUM 8.6* 9.2   PT/INR No results for input(s): LABPROT, INR in the last 72 hours. ABG No results for input(s): PHART, HCO3 in the last 72 hours.  Invalid input(s): PCO2, PO2  Studies/Results: Dg Abd Portable 1v  Result Date: 09/29/2015 CLINICAL DATA:  70 year old male undergoing nasogastric tube placement EXAM: PORTABLE ABDOMEN - 1 VIEW COMPARISON:  Prior abdominal radiographs 08/03/2014 FINDINGS: The tip of the nasogastric tube projects over the region of the gastroesophageal junction. The bowel gas pattern is not obstructed. Residual oral contrast material is present in the colon. No acute osseous abnormality. IMPRESSION: The tip of the nasogastric tube overlies the gastroesophageal junction. Recommend advancing for more optimal position within the gastric lumen. Electronically Signed   By: Jacqulynn Cadet M.D.   On: 09/29/2015 15:56    Anti-infectives: Anti-infectives    Start     Dose/Rate Route Frequency Ordered Stop   09/28/15 1900  meropenem (MERREM) 2 g in sodium  chloride 0.9 % 100 mL IVPB  Status:  Discontinued     2 g 200 mL/hr over 30 Minutes Intravenous Every 8 hours 09/28/15 1833 09/29/15 1706      Assessment/Plan: s/p Procedure(s): EXPLORATORY LAPAROTOMY (N/A) LAPAROSCOPY DIAGNOSTIC (N/A) SMALL BOWEL RESECTION (N/A)  Continue NPO and NG for at least one more day Decrease IVF  LOS: 3 days    Harper Smoker A 10/01/2015

## 2015-10-01 NOTE — Progress Notes (Signed)
Pt ambulated half a lap on unit with no difficulty

## 2015-10-01 NOTE — Plan of Care (Signed)
Problem: Activity: Goal: Risk for activity intolerance will decrease Outcome: Progressing Pt ambulated half a lap on unit.   Problem: Nutrition: Goal: Adequate nutrition will be maintained Outcome: Progressing Pt remains npo  Problem: Bowel/Gastric: Goal: Will not experience complications related to bowel motility Outcome: Progressing Pt passed gas this pm

## 2015-10-02 NOTE — Progress Notes (Signed)
3 Days Post-Op  Subjective: Passing flatus today  Objective: Vital signs in last 24 hours: Temp:  [97.8 F (36.6 C)-98.7 F (37.1 C)] 98 F (36.7 C) (08/13 0905) Pulse Rate:  [57-63] 59 (08/13 0905) Resp:  [15-20] 17 (08/13 0905) BP: (133-155)/(76-88) 155/88 (08/13 0905) SpO2:  [92 %-98 %] 95 % (08/13 0905) Last BM Date: 09/28/15  Intake/Output from previous day: 08/12 0701 - 08/13 0700 In: 2645.8 [I.V.:2565.8; NG/GT:30; IV Piggyback:50] Out: 4070 [Urine:3970; Emesis/NG output:100] Intake/Output this shift: Total I/O In: 0  Out: 400 [Urine:400]  Exam: Abdomen softer, less full  Lab Results:   Recent Labs  09/30/15 0317 10/01/15 0540  WBC 11.1* 9.6  HGB 12.5* 11.7*  HCT 37.7* 36.1*  PLT 116* 128*   BMET  Recent Labs  09/30/15 0317 10/01/15 0540  NA 135 138  K 4.9 5.1  CL 102 102  CO2 27 29  GLUCOSE 179* 141*  BUN 8 7  CREATININE 0.77 0.69  CALCIUM 8.6* 9.2   PT/INR No results for input(s): LABPROT, INR in the last 72 hours. ABG No results for input(s): PHART, HCO3 in the last 72 hours.  Invalid input(s): PCO2, PO2  Studies/Results: No results found.  Anti-infectives: Anti-infectives    Start     Dose/Rate Route Frequency Ordered Stop   09/28/15 1900  meropenem (MERREM) 2 g in sodium chloride 0.9 % 100 mL IVPB  Status:  Discontinued     2 g 200 mL/hr over 30 Minutes Intravenous Every 8 hours 09/28/15 1833 09/29/15 1706      Assessment/Plan: s/p Procedure(s): EXPLORATORY LAPAROTOMY (N/A) LAPAROSCOPY DIAGNOSTIC (N/A) SMALL BOWEL RESECTION (N/A)  D/c NG Start clears  LOS: 4 days    Cung Masterson A 10/02/2015

## 2015-10-02 NOTE — Progress Notes (Signed)
3mg  morphine wasted from PCA syringe during PCA syring replacement. Witnessed by Merian Capron RN

## 2015-10-03 LAB — BASIC METABOLIC PANEL
ANION GAP: 7 (ref 5–15)
BUN: 5 mg/dL — ABNORMAL LOW (ref 6–20)
CALCIUM: 9.2 mg/dL (ref 8.9–10.3)
CO2: 29 mmol/L (ref 22–32)
CREATININE: 0.78 mg/dL (ref 0.61–1.24)
Chloride: 100 mmol/L — ABNORMAL LOW (ref 101–111)
Glucose, Bld: 120 mg/dL — ABNORMAL HIGH (ref 65–99)
Potassium: 4 mmol/L (ref 3.5–5.1)
Sodium: 136 mmol/L (ref 135–145)

## 2015-10-03 MED ORDER — POLYETHYLENE GLYCOL 3350 17 G PO PACK: 17.0000 g | PACK | Freq: Two times a day (BID) | ORAL | Status: DC | PRN

## 2015-10-03 MED ORDER — MORPHINE SULFATE ER 15 MG PO TBCR
30.0000 mg | EXTENDED_RELEASE_TABLET | Freq: Two times a day (BID) | ORAL | Status: DC
  Administered 2015-10-03 – 2015-10-05 (×5): 30 mg via ORAL
  Filled 2015-10-03 (×5): qty 2

## 2015-10-03 MED ORDER — DOCUSATE SODIUM 100 MG PO CAPS
100.0000 mg | ORAL_CAPSULE | Freq: Every day | ORAL | Status: DC
  Administered 2015-10-03 – 2015-10-05 (×3): 100 mg via ORAL
  Filled 2015-10-03 (×3): qty 1

## 2015-10-03 NOTE — Progress Notes (Signed)
[facility] Progress Note  4 Days Post-Op  Subjective: Denies abdominal pain. Pain in back, present before admission secondary to back surgery. + flatus. No Bm yet. Denies fever/chills. Ambulating.  wife, who is a Marine scientist, is in the room and feels comfortable continuing BID dressing changes at home. Objective: Vital signs in last 24 hours: Temp:  [97.9 F (36.6 C)-99.1 F (37.3 C)] 98.5 F (36.9 C) (08/14 0605) Pulse Rate:  [61-64] 61 (08/14 0605) Resp:  [16-20] 19 (08/14 0740) BP: (109-153)/(66-90) 135/72 (08/14 0605) SpO2:  [92 %-100 %] 100 % (08/14 0605) Last BM Date: 09/28/15  Intake/Output from previous day: 08/13 0701 - 08/14 0700 In: 1310 [P.O.:360; I.V.:900; IV Piggyback:50] Out: 3600 [Urine:3600] Intake/Output this shift: Total I/O In: 120 [P.O.:120] Out: -   PE: Gen:  Alert, NAD, pleasant Card:  RRR, no M/G/R heard Pulm:  CTA, no W/R/R Abd: Soft, NT/ND, +BS, no HSM, incisions C/D/I with clean dressing in place.  Lab Results:   Recent Labs  10/01/15 0540  WBC 9.6  HGB 11.7*  HCT 36.1*  PLT 128*   BMET  Recent Labs  10/01/15 0540 10/03/15 0358  NA 138 136  K 5.1 4.0  CL 102 100*  CO2 29 29  GLUCOSE 141* 120*  BUN 7 5*  CREATININE 0.69 0.78  CALCIUM 9.2 9.2   PT/INR No results for input(s): LABPROT, INR in the last 72 hours. CMP     Component Value Date/Time   NA 136 10/03/2015 0358   K 4.0 10/03/2015 0358   CL 100 (L) 10/03/2015 0358   CO2 29 10/03/2015 0358   GLUCOSE 120 (H) 10/03/2015 0358   BUN 5 (L) 10/03/2015 0358   CREATININE 0.78 10/03/2015 0358   CALCIUM 9.2 10/03/2015 0358   PROT 6.5 09/28/2015 1631   ALBUMIN 4.2 09/28/2015 1631   AST 18 09/28/2015 1631   ALT 18 09/28/2015 1631   ALKPHOS 62 09/28/2015 1631   BILITOT 1.4 (H) 09/28/2015 1631   GFRNONAA >60 10/03/2015 0358   GFRAA >60 10/03/2015 0358   Lipase     Component Value Date/Time   LIPASE 16 09/28/2015 1631   Studies/Results: No results  found.  Anti-infectives: Anti-infectives    Start     Dose/Rate Route Frequency Ordered Stop   09/28/15 1900  meropenem (MERREM) 2 g in sodium chloride 0.9 % 100 mL IVPB  Status:  Discontinued     2 g 200 mL/hr over 30 Minutes Intravenous Every 8 hours 09/28/15 1833 09/29/15 1706     Assessment/Plan Perforated small bowel diverticulitis with abscess S/p exploratory laparotomy with small bowel resection 09/29/15 (Dr. Greer Pickerel) - NGT d/c-ed 8/13 - on full dose morphine PCA.  FEN: advance to full liquids ID: DVT Proph: Lovenox, SCD's  Plan: d/c PCA, advance diet, start Opana ER 10 mg BID (home medication), add PRN miralax and colace  Opana not on our formulary - substituted MS CONTIN 30 mg BID per pharmacy recommendation    LOS: 5 days    Jill Alexanders , [facility] 10/03/2015, 9:19 AM Pager: [phone] Consults: [phone] Mon-Fri 7:00 am-4:30 pm Sat-Sun 7:00 am-11:30 am

## 2015-10-03 NOTE — Care Management Important Message (Signed)
Important Message  Patient Details  Name: Jeffery Harvey MRN: [id number] Date of Birth: 1945/09/21   Medicare Important Message Given:  Yes    Orbie Pyo 10/03/2015, 12:06 PM

## 2015-10-03 NOTE — Discharge Summary (Signed)
[facility] Discharge Summary   Patient ID: Jeffery Harvey MRN: [id number] DOB/AGE: 1945-11-30 69 y.o.  Admit date: 09/28/2015 Discharge date: 10/05/2015  Admitting Diagnosis: Perforated small bowel diverticulitis with abscess  Discharge Diagnosis Patient Active Problem List   Diagnosis Date Noted  . Diverticulitis of small intestine with perforation without bleeding 09/30/2015  . Diverticulitis of small intestine with perforation and abscess 09/28/2015  . Dizziness 03/15/2015  . Gout 11/08/2011  . HTN (hypertension) 10/08/2011    Consultants N/A  Imaging: No results found.  Procedures Dr. Greer Pickerel (09/29/15) - Diagnostic laparoscopy converted to exploratory laparotomy with small bowel resection   Hospital Course:  70 y/o male who presented to [facility] with recurrent abdominal pain associated with fever, chills, and nausea. CT scan significant for small bowel diverticular perforation with abscess.  Patient was started on IV antibiotics, admitted, and underwent procedure listed above.  Tolerated procedure well and was transferred to the floor. Diet was advanced as tolerated.  On POD#6, the patient was voiding well, having BM's, tolerating diet, ambulating well, pain well controlled, vital signs stable, incisions c/d/i and felt stable for discharge home. His wife, who is a Marine scientist, will continue BID wet-to-dry dressing changes to his laparotomy site. Patient will follow up in our office with Dr. [name] in 2 weeks and knows to call with questions or concerns.   Physical Exam: General:  Alert, NAD, pleasant, comfortable Abd:  Soft, NT/ND, midline incision C/D/I, 4 staples removed from laparoscopic sites and steri-strips placed    Medication List    TAKE these medications   carvedilol 12.5 MG tablet Commonly known as:  COREG Take 1 tablet (12.5 mg total) by mouth 2 (two) times daily.   DULoxetine 60 MG capsule Commonly known as:  CYMBALTA Take 60 mg by mouth  daily.   losartan 50 MG tablet Commonly known as:  COZAAR Take 1 tablet (50 mg total) by mouth daily.   OPANA ER (CRUSH RESISTANT) 10 MG T12a 12 hr tablet Generic drug:  oxymorphone Take 10 mg by mouth 2 (two) times daily.   polyethylene glycol packet Commonly known as:  MIRALAX / GLYCOLAX Take 17 g by mouth every 12 (twelve) hours as needed for mild constipation, moderate constipation or severe constipation.   ranitidine 150 MG tablet Commonly known as:  ZANTAC Take 150 mg by mouth daily as needed for heartburn.   tamsulosin 0.4 MG Caps capsule Commonly known as:  FLOMAX Take 0.4 mg by mouth daily after supper.   ULORIC 80 MG Tabs Generic drug:  Febuxostat Take 40 mg by mouth every evening.   zolpidem 10 MG tablet Commonly known as:  AMBIEN Take 10 mg by mouth at bedtime. Take every night per patient      Follow-up Information    Gayland Curry, MD. Go on 10/12/2015.   Specialty:  General Surgery Why:  at 9:15 AM for post-operative follow-up from your recent abdominal surgery. Please arrive 30 minutes early to fill out check in paperwork. Contact information: [address] [place]  [address] [phone]          Signed: Obie Dredge, [facility] 10/05/2015, 8:47 AM Pager: [phone] Consults: [phone] Mon-Fri 7:00 am-4:30 pm Sat-Sun 7:00 am-11:30 am

## 2015-10-04 MED ORDER — FAMOTIDINE 20 MG PO TABS
20.0000 mg | ORAL_TABLET | Freq: Every evening | ORAL | Status: DC
  Administered 2015-10-04: 20 mg via ORAL
  Filled 2015-10-04: qty 1

## 2015-10-04 MED ORDER — SODIUM CHLORIDE 0.9% FLUSH: 3.0000 mL | INTRAVENOUS | Status: DC | PRN

## 2015-10-04 MED ORDER — SODIUM CHLORIDE 0.9% FLUSH
3.0000 mL | Freq: Two times a day (BID) | INTRAVENOUS | Status: DC
  Administered 2015-10-04 – 2015-10-05 (×3): 3 mL via INTRAVENOUS

## 2015-10-04 NOTE — Progress Notes (Signed)
[facility] Progress Note  5 Days Post-Op  Subjective: Denies abdominal pain. Tolerating full liquids without N/V. Has had 2 BMs that were loose and non-bloody. Ambulating well. Denies fever/chills.  Objective: Vital signs in last 24 hours: Temp:  [97.5 F (36.4 C)-98.2 F (36.8 C)] 98.2 F (36.8 C) (08/15 0457) Pulse Rate:  [61-68] 63 (08/15 0457) Resp:  [18-19] 18 (08/15 0457) BP: (102-140)/(63-70) 102/63 (08/15 0457) SpO2:  [100 %] 100 % (08/15 0457) Last BM Date: 10/03/15  Intake/Output from previous day: 08/14 0701 - 08/15 0700 In: 3165 [P.O.:600; I.V.:2515; IV Piggyback:50] Out: 500 [Urine:500] Intake/Output this shift: No intake/output data recorded.  PE: Gen:  Alert, NAD, pleasant Card:  RRR, no M/G/R heard Pulm:  CTA, no W/R/R Abd: Soft, NT/ND, +BS, no HSM, incisions C/D/I with clean dressing in place.  Lab Results:  No results for input(s): WBC, HGB, HCT, PLT in the last 72 hours. BMET  Recent Labs  10/03/15 0358  NA 136  K 4.0  CL 100*  CO2 29  GLUCOSE 120*  BUN 5*  CREATININE 0.78  CALCIUM 9.2   PT/INR No results for input(s): LABPROT, INR in the last 72 hours. CMP     Component Value Date/Time   NA 136 10/03/2015 0358   K 4.0 10/03/2015 0358   CL 100 (L) 10/03/2015 0358   CO2 29 10/03/2015 0358   GLUCOSE 120 (H) 10/03/2015 0358   BUN 5 (L) 10/03/2015 0358   CREATININE 0.78 10/03/2015 0358   CALCIUM 9.2 10/03/2015 0358   PROT 6.5 09/28/2015 1631   ALBUMIN 4.2 09/28/2015 1631   AST 18 09/28/2015 1631   ALT 18 09/28/2015 1631   ALKPHOS 62 09/28/2015 1631   BILITOT 1.4 (H) 09/28/2015 1631   GFRNONAA >60 10/03/2015 0358   GFRAA >60 10/03/2015 0358   Lipase     Component Value Date/Time   LIPASE 16 09/28/2015 1631   Studies/Results: No results found.  Anti-infectives: Anti-infectives    Start     Dose/Rate Route Frequency Ordered Stop   09/28/15 1900  meropenem (MERREM) 2 g in sodium chloride 0.9 % 100 mL IVPB   Status:  Discontinued     2 g 200 mL/hr over 30 Minutes Intravenous Every 8 hours 09/28/15 1833 09/29/15 1706     Assessment/Plan Perforated small bowel diverticulitis with abscess S/p exploratory laparotomy with small bowel resection 09/29/15 (Dr. Greer Pickerel) - NGT d/c-ed 8/13 - on Opana ER at home; MS CONTIN 30 BID for pain control here  FEN: advance to regular diet DVT Proph: Lovenox, SCD's  Plan: discharge later today vs tomorrow if tolerates diet    LOS: 6 days    Jill Alexanders , [facility] Surgery 10/04/2015, 8:43 AM Pager: [phone] Consults: [phone] Mon-Fri 7:00 am-4:30 pm Sat-Sun 7:00 am-11:30 am

## 2015-10-05 ENCOUNTER — Encounter: Payer: Self-pay | Admitting: Cardiology

## 2015-10-05 MED ORDER — POLYETHYLENE GLYCOL 3350 17 G PO PACK: 17.0000 g | PACK | Freq: Two times a day (BID) | ORAL | 0 refills | Status: DC | PRN

## 2015-10-05 NOTE — Discharge Instructions (Signed)
Exploratory Laparotomy, Adult, Care After Refer to this sheet in the next few weeks. These instructions provide you with information about caring for yourself after your procedure. Your health care provider may also give you more specific instructions. Your treatment has been planned according to current medical practices, but problems sometimes occur. Call your health care provider if you have any problems or questions after your procedure. WHAT TO EXPECT AFTER THE PROCEDURE After your procedure, it is typical to have:  Abdominal soreness.  Fatigue.  A sore throat from tubes in your throat.  A lack of appetite. HOME CARE INSTRUCTIONS Medicines  Take medicines only as directed by your health care provider.  Do not drive or operate heavy machinery while taking pain medicine. Incision Care  There are many different ways to close and cover an incision, including stitches (sutures), skin glue, and adhesive strips. Follow your health care provider's instructions about:  Incision care.  Bandage (dressing) changes and removal.  Incision closure removal.  Do not take showers or baths until your health care provider says that you can.  Check your incision area daily for signs of infection. Watch for:  Redness.  Tenderness.  Swelling.  Drainage. Activities  Do not lift anything that is heavier than 10 pounds (4.5 kg) until your health care provider says that it is safe.  Try to walk a little bit each day if your health care provider says that it is okay.  Ask your health care provider when you can start to do your usual activities again, such as driving, going back to work, and having sex. Eating and Drinking  You may eat what you usually eat. Include lots of whole grains, fruits, and vegetables in your diet. This will help to prevent constipation.  Drink enough fluid to keep your urine clear or pale yellow. General Instructions  Keep all follow-up visits as directed by  your health care provider. This is important. SEEK MEDICAL CARE IF:   You have a fever.  You have chills.  Your pain medicine is not helping.  You have constipation or diarrhea.  You have nausea or vomiting.  You have drainage, redness, swelling, or pain at your incision site. SEEK IMMEDIATE MEDICAL CARE IF:  Your pain is getting worse.  It has been more than 3 days since you been able to have a bowel movement.  You have ongoing (persistent) vomiting.  The edges of your incision open up.  You have warmth, tenderness, and swelling in your calf.  You have trouble breathing.  You have chest pain.   This information is not intended to replace advice given to you by your health care provider. Make sure you discuss any questions you have with your health care provider.   Document Released: 09/20/2003 Document Revised: 02/26/2014 Document Reviewed: 09/23/2013 Elsevier Interactive Patient Education 2016 [place] A dressing is a material placed over wounds. It keeps the wound clean, dry, and protected from further injury. This provides an environment that favors wound healing.  BEFORE YOU BEGIN  Get your supplies together. Things you may need include:  Saline solution.  Flexible gauze dressing.  Medicated cream.  Tape.  Gloves.  Abdominal dressing pads.  Gauze squares.  Plastic bags.  Take pain medicine 30 minutes before the dressing change if you need it.  Take a shower before you do the first dressing change of the day. Use plastic wrap or a plastic bag to prevent the dressing from getting wet. REMOVING YOUR  OLD DRESSING   Wash your hands with soap and water. Dry your hands with a clean towel.  Put on your gloves.  Remove any tape.  Carefully remove the old dressing. If the dressing sticks, you may dampen it with warm water to loosen it, or follow your caregiver's specific directions.  Remove any gauze or packing tape that is in  your wound.  Take off your gloves.  Put the gloves, tape, gauze, or any packing tape into a plastic bag. CHANGING YOUR DRESSING  Open the supplies.  Take the cap off the saline solution.  Open the gauze package so that the gauze remains on the inside of the package.  Put on your gloves.  Clean your wound as told by your caregiver.  If you have been told to keep your wound dry, follow those instructions.  Your caregiver may tell you to do one or more of the following:  Pick up the gauze. Pour the saline solution over the gauze. Squeeze out the extra saline solution.  Put medicated cream or other medicine on your wound if you have been told to do so.  Put the solution soaked gauze only in your wound, not on the skin around it.  Pack your wound loosely or as told by your caregiver.  Put dry gauze on your wound.  Put abdominal dressing pads over the dry gauze if your wet gauze soaks through.  Tape the abdominal dressing pads in place so they will not fall off. Do not wrap the tape completely around the affected part (arm, leg, abdomen).  Wrap the dressing pads with a flexible gauze dressing to secure it in place.  Take off your gloves. Put them in the plastic bag with the old dressing. Tie the bag shut and throw it away.  Keep the dressing clean and dry until your next dressing change.  Wash your hands. SEEK MEDICAL CARE IF:  Your skin around the wound looks red.  Your wound feels more tender or sore.  You see pus in the wound.  Your wound smells bad.  You have a fever.  Your skin around the wound has a rash that itches and burns.  You see black or yellow skin in your wound that was not there before.  You feel nauseous, throw up, and feel very tired.   This information is not intended to replace advice given to you by your health care provider. Make sure you discuss any questions you have with your health care provider.   Document Released: 03/15/2004  Document Revised: 04/30/2011 Document Reviewed: 12/18/2010 Elsevier Interactive Patient Education 2016 Elsevier Inc.  Low-Fiber Diet Fiber is found in fruits, vegetables, and whole grains. A low-fiber diet restricts fibrous foods that are not digested in the small intestine. A diet containing about 10-15 grams of fiber per day is considered low fiber. Low-fiber diets may be used to:   Promote healing and rest the bowel during intestinal flare-ups.  Prevent blockage of a partially obstructed or narrowed gastrointestinal tract.  Reduce fecal weight and volume.  Slow the movement of feces. You may be on a low-fiber diet as a transitional diet following surgery, after an injury (trauma), or because of a short (acute) or lifelong (chronic) illness. Your health care provider will determine the length of time you need to stay on this diet.  WHAT DO I NEED TO KNOW ABOUT A LOW-FIBER DIET? Always check the fiber content on the packaging's Nutrition Facts label, especially on foods from the  grains list. Ask your dietitian if you have questions about specific foods that are related to your condition, especially if the food is not listed below. In general, a low-fiber food will have less than 2 g of fiber. WHAT FOODS CAN I EAT? Grains All breads and crackers made with white flour. Sweet rolls, doughnuts, waffles, pancakes, [place] toast, bagels. Pretzels, Melba toast, zwieback. Well-cooked cereals, such as cornmeal, farina, or cream cereals. Dry cereals that do not contain whole grains, fruit, or nuts, such as refined corn, wheat, rice, and oat cereals. Potatoes prepared any way without skins, plain pastas and noodles, refined white rice. Use white flour for baking and making sauces. Use allowed list of grains for casseroles, dumplings, and puddings.  Vegetables Strained tomato and vegetable juices. Fresh lettuce, cucumber, spinach. Well-cooked (no skin or pulp) or canned vegetables, such as asparagus, bean  sprouts, beets, carrots, green beans, mushrooms, potatoes, pumpkin, spinach, yellow squash, tomato sauce/puree, turnips, yams, and zucchini. Keep servings limited to  cup.  Fruits All fruit juices except prune juice. Cooked or canned fruits without skin and seeds, such as applesauce, apricots, cherries, fruit cocktail, grapefruit, grapes, mandarin oranges, melons, peaches, pears, pineapple, and plums. Fresh fruits without skin, such as apricots, avocados, bananas, melons, pineapple, nectarines, and peaches. Keep servings limited to  cup or 1 piece.  Meat and Other Protein Sources Ground or well-cooked tender beef, ham, veal, lamb, pork, or poultry. Eggs, plain cheese. Fish, oysters, shrimp, lobster, and other seafood. Liver, organ meats. Smooth nut butters. Dairy All milk products and alternative dairy substitutes, such as soy, rice, almond, and coconut, not containing added whole nuts, seeds, or added fruit. Beverages Decaf coffee, fruit, and vegetable juices or smoothies (small amounts, with no pulp or skins, and with fruits from allowed list), sports drinks, herbal tea. Condiments Ketchup, mustard, vinegar, cream sauce, cheese sauce, cocoa powder. Spices in moderation, such as allspice, basil, bay leaves, celery powder or leaves, cinnamon, cumin powder, curry powder, ginger, mace, marjoram, onion or garlic powder, oregano, paprika, parsley flakes, ground pepper, rosemary, sage, savory, tarragon, thyme, and turmeric. Sweets and Desserts Plain cakes and cookies, pie made with allowed fruit, pudding, custard, cream pie. Gelatin, fruit, ice, sherbet, frozen ice pops. Ice cream, ice milk without nuts. Plain hard candy, honey, jelly, molasses, syrup, sugar, chocolate syrup, gumdrops, marshmallows. Limit overall sugar intake.  Fats and Oil Margarine, butter, cream, mayonnaise, salad oils, plain salad dressings made from allowed foods. Choose healthy fats such as olive oil, canola oil, and omega-3 fatty  acids (such as found in salmon or tuna) when possible.  Other Bouillon, broth, or cream soups made from allowed foods. Any strained soup. Casseroles or mixed dishes made with allowed foods. The items listed above may not be a complete list of recommended foods or beverages. Contact your dietitian for more options.  WHAT FOODS ARE NOT RECOMMENDED? Grains All whole wheat and whole grain breads and crackers. Multigrains, rye, bran seeds, nuts, or coconut. Cereals containing whole grains, multigrains, bran, coconut, nuts, raisins. Cooked or dry oatmeal, steel-cut oats. Coarse wheat cereals, granola. Cereals advertised as high fiber. Potato skins. Whole grain pasta, wild or brown rice. Popcorn. Coconut flour. Bran, buckwheat, corn bread, multigrains, rye, wheat germ.  Vegetables Fresh, cooked or canned vegetables, such as artichokes, asparagus, beet greens, broccoli, Brussels sprouts, cabbage, celery, cauliflower, corn, eggplant, kale, legumes or beans, okra, peas, and tomatoes. Avoid large servings of any vegetables, especially raw vegetables.  Fruits Fresh fruits, such as apples with  or without skin, berries, cherries, figs, grapes, grapefruit, guavas, kiwis, mangoes, oranges, papayas, pears, persimmons, pineapple, and pomegranate. Prune juice and juices with pulp, stewed or dried prunes. Dried fruits, dates, raisins. Fruit seeds or skins. Avoid large servings of all fresh fruits. Meats and Other Protein Sources Tough, fibrous meats with gristle. Chunky nut butter. Cheese made with seeds, nuts, or other foods not recommended. Nuts, seeds, legumes (beans, including baked beans), dried peas, beans, lentils.  Dairy Yogurt or cheese that contains nuts, seeds, or added fruit.  Beverages Fruit juices with high pulp, prune juice. Caffeinated coffee and teas.  Condiments Coconut, maple syrup, pickles, olives. Sweets and Desserts Desserts, cookies, or candies that contain nuts or coconut, chunky peanut  butter, dried fruits. Jams, preserves with seeds, marmalade. Large amounts of sugar and sweets. Any other dessert made with fruits from the not recommended list.  Other Soups made from vegetables that are not recommended or that contain other foods not recommended.  The items listed above may not be a complete list of foods and beverages to avoid. Contact your dietitian for more information.   This information is not intended to replace advice given to you by your health care provider. Make sure you discuss any questions you have with your health care provider.   Document Released: 07/28/2001 Document Revised: 02/10/2013 Document Reviewed: 12/29/2012 Elsevier Interactive Patient Education [employer].

## 2015-10-26 DIAGNOSIS — M4806 Spinal stenosis, lumbar region: Secondary | ICD-10-CM | POA: Diagnosis not present

## 2015-10-26 DIAGNOSIS — M533 Sacrococcygeal disorders, not elsewhere classified: Secondary | ICD-10-CM | POA: Diagnosis not present

## 2015-11-22 DIAGNOSIS — Z23 Encounter for immunization: Secondary | ICD-10-CM | POA: Diagnosis not present

## 2015-11-22 DIAGNOSIS — D0472 Carcinoma in situ of skin of left lower limb, including hip: Secondary | ICD-10-CM | POA: Diagnosis not present

## 2016-01-11 DIAGNOSIS — D485 Neoplasm of uncertain behavior of skin: Secondary | ICD-10-CM | POA: Diagnosis not present

## 2016-01-11 DIAGNOSIS — L309 Dermatitis, unspecified: Secondary | ICD-10-CM | POA: Diagnosis not present

## 2016-01-11 DIAGNOSIS — Z23 Encounter for immunization: Secondary | ICD-10-CM | POA: Diagnosis not present

## 2016-01-23 DIAGNOSIS — M48061 Spinal stenosis, lumbar region without neurogenic claudication: Secondary | ICD-10-CM | POA: Diagnosis not present

## 2016-01-23 DIAGNOSIS — M533 Sacrococcygeal disorders, not elsewhere classified: Secondary | ICD-10-CM | POA: Diagnosis not present

## 2016-01-27 DIAGNOSIS — M150 Primary generalized (osteo)arthritis: Secondary | ICD-10-CM | POA: Diagnosis not present

## 2016-01-27 DIAGNOSIS — M5136 Other intervertebral disc degeneration, lumbar region: Secondary | ICD-10-CM | POA: Diagnosis not present

## 2016-01-27 DIAGNOSIS — M1A09X0 Idiopathic chronic gout, multiple sites, without tophus (tophi): Secondary | ICD-10-CM | POA: Diagnosis not present

## 2016-02-13 NOTE — Progress Notes (Signed)
[name] Date of Birth: 1946-01-23 Medical Record [id number]  History of Present Illness: Jeffery Harvey is seen  for follow up of HTN. He has HTN, gout and occasional GERD. He has no known CAD. Echocardiogram and Cardiolite studies in May of 2012 are unremarkable.  BP has been well controlled. No complaints of chest pain or SOB.   He underwent cholecystectomy in August 2015 at [facility]. He had ERCP done 2016 by Dr. Paulita Fujita.   In August 2017 he presented with acute abdominal pain. He had small bowel diverticulitis with perforation and underwent partial small bowel resection. He had no complications. He is feeling back to normal.     Current Outpatient Prescriptions on File Prior to Visit  Medication Sig Dispense Refill  . carvedilol (COREG) 12.5 MG tablet Take 1 tablet (12.5 mg total) by mouth 2 (two) times daily. 180 tablet 3  . DULoxetine (CYMBALTA) 60 MG capsule Take 60 mg by mouth daily.    . Febuxostat (ULORIC) 80 MG TABS Take 40 mg by mouth every evening.     Marland Kitchen losartan (COZAAR) 50 MG tablet Take 1 tablet (50 mg total) by mouth daily. 30 tablet 11  . polyethylene glycol (MIRALAX / GLYCOLAX) packet Take 17 g by mouth every 12 (twelve) hours as needed for mild constipation, moderate constipation or severe constipation. 14 each 0  . ranitidine (ZANTAC) 150 MG tablet Take 150 mg by mouth daily as needed for heartburn.     . tamsulosin (FLOMAX) 0.4 MG CAPS capsule Take 0.4 mg by mouth daily after supper.     No current facility-administered medications on file prior to visit.     Allergies  Allergen Reactions  . Flagyl [Metronidazole] Hives    Reported by patient's wife  . Penicillins Hives    Has patient had a PCN reaction causing immediate rash, facial/tongue/throat swelling, SOB or lightheadedness with hypotension: Yes Has patient had a PCN reaction causing severe rash involving mucus membranes or skin necrosis: No Has patient had a PCN reaction that required  hospitalization No Has patient had a PCN reaction occurring within the last 10 years: Yes If all of the above answers are "NO", then may proceed with Cephalosporin use.  HAS tolerated meropenem (09/2015)  . Allopurinol Swelling  . Amlodipine Besy-Benazepril Hcl     UNKNOWN  . Ancef [Cefazolin] Hives  . Dexilant [Dexlansoprazole] Nausea And Vomiting  . Vioxx [Rofecoxib] Swelling    Past Medical History:  Diagnosis Date  . Arthritis    narrowing and disc degeneration of lower back"chroin pain"  . Cancer ([place])    skin cancer  . Chronic back pain    chronic back pain- see pain management MD  . Diverticulitis    had tiny perforation of bowel- 04/2014  . GI bleed   . Gout   . Hypertension   . PONV (postoperative nausea and vomiting)   . Swallowing difficulty    can swallow, but often times has to spit up" bring up a lot of foamy tough"  . Transfusion history    x2 episodes -post procedure polypectomies at different times under 2 yrs ago    Past Surgical History:  Procedure Laterality Date  . APPENDECTOMY     open,"rupture"  . BOWEL RESECTION N/A 09/29/2015   Procedure: SMALL BOWEL RESECTION;  Surgeon: Greer Pickerel, MD;  Location: [place];  Service: General;  Laterality: N/A;  . CERVICAL FUSION  2000  . CHOLECYSTECTOMY    . CHOLECYSTECTOMY, LAPAROSCOPIC  8/15  .  COLONOSCOPY W/ BIOPSIES AND POLYPECTOMY    . ERCP N/A 06/02/2014   Procedure: ENDOSCOPIC RETROGRADE CHOLANGIOPANCREATOGRAPHY (ERCP);  Surgeon: Arta Silence, MD;  Location[facility];  Service: Endoscopy;  Laterality: N/A;  . ERCP N/A 06/29/2014   Procedure: ENDOSCOPIC RETROGRADE CHOLANGIOPANCREATOGRAPHY (ERCP);  Surgeon: Arta Silence, MD;  Location: [facility];  Service: Endoscopy;  Laterality: N/A;  . ESOPHAGOGASTRODUODENOSCOPY (EGD) WITH PROPOFOL N/A 08/04/2014   Procedure: ESOPHAGOGASTRODUODENOSCOPY (EGD) WITH PROPOFOL;  Surgeon: Arta Silence, MD;  [facility]: [facility];  Service: Endoscopy;  Laterality: N/A;   . EUS N/A 05/26/2014   Procedure: FULL UPPER ENDOSCOPIC ULTRASOUND (EUS) RADIAL;  Surgeon: Arta Silence, MD;  [facility];  Service: Endoscopy;  Laterality: N/A;  . EUS N/A 06/29/2014   Procedure: UPPER ENDOSCOPIC ULTRASOUND (EUS) RADIAL;  Surgeon: Arta Silence, MD;  [facility];  Service: Endoscopy;  Laterality: N/A;  . HERNIA REPAIR     Bilateral  . LAPAROSCOPY N/A 09/29/2015   Procedure: LAPAROSCOPY DIAGNOSTIC;  Surgeon: Greer Pickerel, MD;  Location: [place];  Service: General;  Laterality: N/A;  . LAPAROTOMY N/A 09/29/2015   Procedure: EXPLORATORY LAPAROTOMY;  Surgeon: Greer Pickerel, MD;  Location: [place];  Service: General;  Laterality: N/A;    History  Smoking Status  . Never Smoker  Smokeless Tobacco  . Never Used    History  Alcohol use Not on file    Comment: none in 2 years    Family History  Problem Relation Age of Onset  . Other Father 68    Blood Disorder  . Heart failure Mother 31  . Colon cancer Mother   . Hypertension Sister 76    Valve repaired    Review of Systems: The review of systems is per the HPI.  All other systems were reviewed and are negative.  Physical Exam: BP 124/80   Pulse (!) 58   Ht 6\' 2"  (1.88 m)   Wt 219 lb (99.3 kg)   BMI 28.12 kg/m  Patient is very pleasant and in no acute distress. Skin is warm and dry.  HEENT is unremarkable. Normocephalic/atraumatic. PERRL. Sclera are nonicteric. Neck is supple. No masses. No JVD. Lungs are clear. Cardiac exam shows a regular rate and rhythm. Abdomen is soft. Extremities are without edema. Gait and ROM are intact. No gross neurologic deficits noted.   LABORATORY DATA:  Ecg: today shows NSR with normal Ecg.   Labs done 09/28/15 reviewed. Normal CMET except glucose 107, CBC- normal except platelets 136K.  Lab Results  Component Value Date   WBC 9.6 10/01/2015   HGB 11.7 (L) 10/01/2015   HCT 36.1 (L) 10/01/2015   PLT 128 (L) 10/01/2015   GLUCOSE 120 (H) 10/03/2015   ALT 18  09/28/2015   AST 18 09/28/2015   NA 136 10/03/2015   K 4.0 10/03/2015   CL 100 (L) 10/03/2015   CREATININE 0.78 10/03/2015   BUN 5 (L) 10/03/2015   CO2 29 10/03/2015     Assessment / Plan: 1. Hypertension- Well controlled.  Continue coreg and losartan. Follow up in 6 months.   2. Episodes of lightheadedness. Resolved.

## 2016-02-17 ENCOUNTER — Ambulatory Visit (INDEPENDENT_AMBULATORY_CARE_PROVIDER_SITE_OTHER): Payer: PPO | Admitting: Cardiology

## 2016-02-17 ENCOUNTER — Encounter: Payer: Self-pay | Admitting: Cardiology

## 2016-02-17 VITALS — BP 124/80 | HR 58 | Ht 74.0 in | Wt 219.0 lb

## 2016-02-17 DIAGNOSIS — I10 Essential (primary) hypertension: Secondary | ICD-10-CM

## 2016-02-17 NOTE — Patient Instructions (Addendum)
Continue your current therapy  I will see you in 6 months.   

## 2016-03-30 ENCOUNTER — Other Ambulatory Visit: Payer: Self-pay | Admitting: Cardiology

## 2016-03-30 MED ORDER — LOSARTAN POTASSIUM 50 MG PO TABS: 50.0000 mg | ORAL_TABLET | Freq: Every day | ORAL | 10 refills | Status: DC

## 2016-03-30 NOTE — Telephone Encounter (Signed)
Rx(s) sent to pharmacy electronically.  

## 2016-04-16 DIAGNOSIS — M533 Sacrococcygeal disorders, not elsewhere classified: Secondary | ICD-10-CM | POA: Diagnosis not present

## 2016-04-16 DIAGNOSIS — M48061 Spinal stenosis, lumbar region without neurogenic claudication: Secondary | ICD-10-CM | POA: Diagnosis not present

## 2016-05-17 DIAGNOSIS — Z86010 Personal history of colonic polyps: Secondary | ICD-10-CM | POA: Diagnosis not present

## 2016-05-18 ENCOUNTER — Other Ambulatory Visit: Payer: Self-pay | Admitting: Cardiology

## 2016-05-18 NOTE — Telephone Encounter (Signed)
Rx request sent to pharmacy.  

## 2016-07-23 DIAGNOSIS — M48061 Spinal stenosis, lumbar region without neurogenic claudication: Secondary | ICD-10-CM | POA: Diagnosis not present

## 2016-07-23 DIAGNOSIS — M47897 Other spondylosis, lumbosacral region: Secondary | ICD-10-CM | POA: Diagnosis not present

## 2016-07-23 DIAGNOSIS — M533 Sacrococcygeal disorders, not elsewhere classified: Secondary | ICD-10-CM | POA: Diagnosis not present

## 2016-08-05 NOTE — Progress Notes (Signed)
Jeffery Harvey Date of Birth: [birth year] Medical Record #[id number]  History of Present Illness: Jeffery Harvey is seen  for follow up of HTN. He has HTN, gout and occasional GERD. He has no known CAD. Echocardiogram and Cardiolite studies in May of 2012 are unremarkable.  BP has been well controlled. No complaints of chest pain or SOB.   He underwent cholecystectomy in August 2015 at [facility]. He had ERCP done 2016 by Dr. Paulita Fujita.   In August 2017 he presented with acute abdominal pain. He had small bowel diverticulitis with perforation and underwent partial small bowel resection. He had no complications.   He is followed by physical and Rehab medicine for lumbar stenosis and Neurogenic claudication.   On follow up today he is doing well. He has no cardiac complaints. 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     Current Outpatient Prescriptions on File Prior to Visit  Medication Sig Dispense Refill  . carvedilol (COREG) 12.5 MG tablet TAKE 1 TABLET TWICE DAILY 180 tablet 3  . DULoxetine (CYMBALTA) 60 MG capsule Take 60 mg by mouth daily.    . Febuxostat (ULORIC) 80 MG TABS Take 40 mg by mouth every evening.     Marland Kitchen losartan (COZAAR) 50 MG tablet Take 1 tablet (50 mg total) by mouth daily. 30 tablet 10  . polyethylene glycol (MIRALAX / GLYCOLAX) packet Take 17 g by mouth every 12 (twelve) hours as needed for mild constipation, moderate constipation or severe constipation. 14 each 0  . ranitidine (ZANTAC) 150 MG tablet Take 150 mg by mouth daily as needed for heartburn.     . tamsulosin (FLOMAX) 0.4 MG CAPS capsule Take 0.4 mg by mouth daily after supper.     No current facility-administered medications on file prior to visit.     Allergies  Allergen Reactions  . Flagyl [Metronidazole] Hives    Reported by patient's wife  . Penicillins Hives    Has patient had a PCN reaction causing immediate rash, facial/tongue/throat swelling, SOB or lightheadedness with  hypotension: Yes Has patient had a PCN reaction causing severe rash involving mucus membranes or skin necrosis: No Has patient had a PCN reaction that required hospitalization No Has patient had a PCN reaction occurring within the last 10 years: Yes If all of the above answers are "NO", then may proceed with Cephalosporin use.  HAS tolerated meropenem (09/2015)  . Allopurinol Swelling  . Amlodipine Besy-Benazepril Hcl     UNKNOWN  . Ancef [Cefazolin] Hives  . Dexilant [Dexlansoprazole] Nausea And Vomiting  . Vioxx [Rofecoxib] Swelling    Past Medical History:  Diagnosis Date  . Arthritis    narrowing and disc degeneration of lower back"chroin pain"  . Cancer ([place])    skin cancer  . Chronic back pain    chronic back pain- see pain management MD  . Diverticulitis    had tiny perforation of bowel- 04/2014  . GI bleed   . Gout   . Hypertension   . PONV (postoperative nausea and vomiting)   . Swallowing difficulty    can swallow, but often times has to spit up" bring up a lot of foamy tough"  . Transfusion history    x2 episodes -post procedure polypectomies at different times under 2 yrs ago    Past Surgical History:  Procedure Laterality Date  . APPENDECTOMY     open,"rupture"  . BOWEL RESECTION N/A 09/29/2015   Procedure: SMALL BOWEL RESECTION;  Surgeon: Greer Pickerel,  MD;  Location: [place];  Service: General;  Laterality: N/A;  . CERVICAL FUSION  2000  . CHOLECYSTECTOMY    . CHOLECYSTECTOMY, LAPAROSCOPIC  8/15  . COLONOSCOPY W/ BIOPSIES AND POLYPECTOMY    . ERCP N/A 06/02/2014   Procedure: ENDOSCOPIC RETROGRADE CHOLANGIOPANCREATOGRAPHY (ERCP);  Surgeon: Arta Silence, MD;  Location[facility];  Service: Endoscopy;  Laterality: N/A;  . ERCP N/A 06/29/2014   Procedure: ENDOSCOPIC RETROGRADE CHOLANGIOPANCREATOGRAPHY (ERCP);  Surgeon: Arta Silence, MD;  Location[facility];  Service: Endoscopy;  Laterality: N/A;  . ESOPHAGOGASTRODUODENOSCOPY (EGD) WITH PROPOFOL N/A  08/04/2014   Procedure: ESOPHAGOGASTRODUODENOSCOPY (EGD) WITH PROPOFOL;  Surgeon: Arta Silence, MD;  [facility];  Service: Endoscopy;  Laterality: N/A;  . EUS N/A 05/26/2014   Procedure: FULL UPPER ENDOSCOPIC ULTRASOUND (EUS) RADIAL;  Surgeon: Arta Silence, MD;  [facility];  Service: Endoscopy;  Laterality: N/A;  . EUS N/A 06/29/2014   Procedure: UPPER ENDOSCOPIC ULTRASOUND (EUS) RADIAL;  Surgeon: Arta Silence, MD;  [facility];  Service: Endoscopy;  Laterality: N/A;  . HERNIA REPAIR     Bilateral  . LAPAROSCOPY N/A 09/29/2015   Procedure: LAPAROSCOPY DIAGNOSTIC;  Surgeon: Greer Pickerel, MD;  Location: [place];  Service: General;  Laterality: N/A;  . LAPAROTOMY N/A 09/29/2015   Procedure: EXPLORATORY LAPAROTOMY;  Surgeon: Greer Pickerel, MD;  Location: [place];  Service: General;  Laterality: N/A;    History  Smoking Status  . Never Smoker  Smokeless Tobacco  . Never Used    History  Alcohol use Not on file    Comment: none in 2 years    Family History  Problem Relation Age of Onset  . Other Father 36       Blood Disorder  . Heart failure Mother 81  . Colon cancer Mother   . Hypertension Sister 22       Valve repaired    Review of Systems: The review of systems is per the HPI.  All other systems were reviewed and are negative.  Physical Exam: BP 124/78   Pulse 62   Ht 6\' 2"  (1.88 m)   Wt 224 lb (101.6 kg)   BMI 28.76 kg/m  Patient is very pleasant and in no acute distress. Skin is warm and dry.  HEENT is unremarkable. Normocephalic/atraumatic. PERRL. Sclera are nonicteric. Neck is supple. No masses. No JVD. Lungs are clear. Cardiac exam shows a regular rate and rhythm. Abdomen is soft. Extremities are without edema. Gait and ROM are intact. No gross neurologic deficits noted.   LABORATORY DATA:  Ecg: today shows NSR with normal Ecg.   Labs done 09/28/15 reviewed. Normal CMET except glucose 107, CBC- normal except platelets 136K.  Lab  Results  Component Value Date   WBC 9.6 10/01/2015   HGB 11.7 (L) 10/01/2015   HCT 36.1 (L) 10/01/2015   PLT 128 (L) 10/01/2015   GLUCOSE 120 (H) 10/03/2015   ALT 18 09/28/2015   AST 18 09/28/2015   NA 136 10/03/2015   K 4.0 10/03/2015   CL 100 (L) 10/03/2015   CREATININE 0.78 10/03/2015   BUN 5 (L) 10/03/2015   CO2 29 10/03/2015     Assessment / Plan: 1. Hypertension- Well controlled.  Continue coreg and losartan. Follow up in 6 months with lab work.

## 2016-08-08 ENCOUNTER — Encounter: Payer: Self-pay | Admitting: Cardiology

## 2016-08-08 ENCOUNTER — Ambulatory Visit (INDEPENDENT_AMBULATORY_CARE_PROVIDER_SITE_OTHER): Payer: PPO | Admitting: Cardiology

## 2016-08-08 VITALS — BP 124/78 | HR 62 | Ht 74.0 in | Wt 224.0 lb

## 2016-08-08 DIAGNOSIS — I10 Essential (primary) hypertension: Secondary | ICD-10-CM

## 2016-08-08 NOTE — Patient Instructions (Signed)
Continue your current therapy  I will see you in 6 months with lab work   

## 2016-08-30 ENCOUNTER — Ambulatory Visit
Admission: RE | Admit: 2016-08-30 | Discharge: 2016-08-30 | Disposition: A | Discharge status: Home / Self Care | Payer: PPO | Source: Ambulatory Visit | Attending: General Surgery | Admitting: General Surgery

## 2016-08-30 ENCOUNTER — Ambulatory Visit: Payer: Self-pay | Admitting: General Surgery

## 2016-08-30 ENCOUNTER — Other Ambulatory Visit: Payer: Self-pay | Admitting: General Surgery

## 2016-08-30 DIAGNOSIS — K5730 Diverticulosis of large intestine without perforation or abscess without bleeding: Secondary | ICD-10-CM | POA: Diagnosis not present

## 2016-08-30 DIAGNOSIS — K432 Incisional hernia without obstruction or gangrene: Secondary | ICD-10-CM

## 2016-08-30 DIAGNOSIS — I10 Essential (primary) hypertension: Secondary | ICD-10-CM | POA: Diagnosis not present

## 2016-08-30 DIAGNOSIS — M545 Low back pain: Secondary | ICD-10-CM | POA: Diagnosis not present

## 2016-08-30 DIAGNOSIS — G8929 Other chronic pain: Secondary | ICD-10-CM | POA: Diagnosis not present

## 2016-08-30 DIAGNOSIS — K4090 Unilateral inguinal hernia, without obstruction or gangrene, not specified as recurrent: Secondary | ICD-10-CM | POA: Diagnosis not present

## 2016-08-30 MED ORDER — IOPAMIDOL (ISOVUE-300) INJECTION 61%
125.0000 mL | Freq: Once | INTRAVENOUS | Status: AC | PRN
  Administered 2016-08-30: 125 mL via INTRAVENOUS

## 2016-08-30 NOTE — Patient Instructions (Addendum)
Jeffery Harvey  08/30/2016   Your procedure is scheduled [id number] 09-04-16   Report to [facility] Main  Entrance Take [place]  elevators to 3rd floor to  [place] at 900 AM.   Call this number if you have problems the morning of surgery [phone]   Remember: ONLY 1 PERSON MAY GO WITH YOU TO SHORT STAY TO GET  READY MORNING OF [place].  Do not eat food or drink liquids :After Midnight.     Take these medicines the morning of surgery with A SIP OF WATER: CARVEDILOL(COREG), DULOXETIN (CYMBALTA), OXYCODONE if needed              You may not have any metal on your body including hair pins and              piercings  Do not wear jewelry, make-up, lotions, powders or perfumes, deodorant                         Men may shave face and neck.   Do not bring valuables to the hospital. [place].  Contacts, dentures or bridgework may not be worn into surgery.  Leave suitcase in the car. After surgery it may be brought to your room.                   Please read over the following fact sheets you were given: _____________________________________________________________________             [facility] - Preparing for Surgery Before surgery, you can play an important role.  Because skin is not sterile, your skin needs to be as free of germs as possible.  You can reduce the number of germs on your skin by washing with CHG (chlorahexidine gluconate) soap before surgery.  CHG is an antiseptic cleaner which kills germs and bonds with the skin to continue killing germs even after washing. Please DO NOT use if you have an allergy to CHG or antibacterial soaps.  If your skin becomes reddened/irritated stop using the CHG and inform your nurse when you arrive at Short Stay. Do not shave (including legs and underarms) for at least 48 hours prior to the first CHG shower.  You may shave your face/neck. Please follow  these instructions carefully:  1.  Shower with CHG Soap the night before surgery and the  morning of Surgery.  2.  If you choose to wash your hair, wash your hair first as usual with your  normal  shampoo.  3.  After you shampoo, rinse your hair and body thoroughly to remove the  shampoo.                           4.  Use CHG as you would any other liquid soap.  You can apply chg directly  to the skin and wash                       Gently with a scrungie or clean washcloth.  5.  Apply the CHG Soap to your body ONLY FROM THE NECK DOWN.   Do not use on face/ open  Wound or open sores. Avoid contact with eyes, ears mouth and genitals (private parts).                       Wash face,  Genitals (private parts) with your normal soap.             6.  Wash thoroughly, paying special attention to the area where your surgery  will be performed.  7.  Thoroughly rinse your body with warm water from the neck down.  8.  DO NOT shower/wash with your normal soap after using and rinsing off  the CHG Soap.                9.  Pat yourself dry with a clean towel.            10.  Wear clean pajamas.            11.  Place clean sheets on your bed the night of your first shower and do not  sleep with pets. Day of Surgery : Do not apply any lotions/deodorants the morning of surgery.  Please wear clean clothes to the hospital/surgery center.  FAILURE TO FOLLOW THESE INSTRUCTIONS MAY RESULT IN THE CANCELLATION OF YOUR SURGERY PATIENT SIGNATURE_________________________________  NURSE SIGNATURE__________________________________  ________________________________________________________________________

## 2016-08-30 NOTE — Progress Notes (Signed)
EKG AND LOV [facility] [place] [facility] 02-17-16 EPIC

## 2016-08-31 ENCOUNTER — Encounter (HOSPITAL_COMMUNITY): Payer: Self-pay

## 2016-08-31 ENCOUNTER — Encounter (HOSPITAL_COMMUNITY)
Admission: RE | Admit: 2016-08-31 | Discharge: 2016-08-31 | Disposition: A | Discharge status: Home / Self Care | Payer: PPO | Source: Ambulatory Visit | Attending: General Surgery | Admitting: General Surgery

## 2016-08-31 DIAGNOSIS — K432 Incisional hernia without obstruction or gangrene: Secondary | ICD-10-CM | POA: Insufficient documentation

## 2016-08-31 DIAGNOSIS — Z01812 Encounter for preprocedural laboratory examination: Secondary | ICD-10-CM | POA: Diagnosis not present

## 2016-08-31 LAB — COMPREHENSIVE METABOLIC PANEL
ALBUMIN: 4.7 g/dL (ref 3.5–5.0)
ALK PHOS: 72 U/L (ref 38–126)
ALT: 23 U/L (ref 17–63)
ANION GAP: 7 (ref 5–15)
AST: 35 U/L (ref 15–41)
BILIRUBIN TOTAL: 1.5 mg/dL — AB (ref 0.3–1.2)
BUN: 15 mg/dL (ref 6–20)
CO2: 29 mmol/L (ref 22–32)
Calcium: 9.5 mg/dL (ref 8.9–10.3)
Chloride: 105 mmol/L (ref 101–111)
Creatinine, Ser: 0.92 mg/dL (ref 0.61–1.24)
GFR calc non Af Amer: >60 mL/min (ref >60)
GLUCOSE: 90 mg/dL (ref 65–99)
Potassium: 5.8 mmol/L — ABNORMAL HIGH (ref 3.5–5.1)
Sodium: 141 mmol/L (ref 135–145)
Total Protein: 7 g/dL (ref 6.5–8.1)

## 2016-08-31 LAB — CBC WITH DIFFERENTIAL/PLATELET
Basophils Absolute: 0.1 10*3/uL (ref 0.0–0.1)
Basophils Relative: 1 %
Eosinophils Absolute: 0.2 10*3/uL (ref 0.0–0.7)
Eosinophils Relative: 3 %
HEMATOCRIT: 42.8 % (ref 39.0–52.0)
HEMOGLOBIN: 15.2 g/dL (ref 13.0–17.0)
LYMPHS ABS: 1.4 10*3/uL (ref 0.7–4.0)
LYMPHS PCT: 23 %
MCH: 30.9 pg (ref 26.0–34.0)
MCHC: 35.5 g/dL (ref 30.0–36.0)
MCV: 87 fL (ref 78.0–100.0)
MONOS PCT: 8 %
Monocytes Absolute: 0.5 10*3/uL (ref 0.1–1.0)
NEUTROS ABS: 3.9 10*3/uL (ref 1.7–7.7)
NEUTROS PCT: 65 %
Platelets: 142 10*3/uL — ABNORMAL LOW (ref 150–400)
RBC: 4.92 MIL/uL (ref 4.22–5.81)
RDW: 12.3 % (ref 11.5–15.5)
WBC: 6 10*3/uL (ref 4.0–10.5)

## 2016-09-03 ENCOUNTER — Ambulatory Visit: Payer: Self-pay | Admitting: General Surgery

## 2016-09-03 MED ORDER — VANCOMYCIN HCL 10 G IV SOLR
1500.0000 mg | INTRAVENOUS | Status: AC
  Administered 2016-09-04: 1500 mg via INTRAVENOUS
  Filled 2016-09-03: qty 1500

## 2016-09-04 ENCOUNTER — Ambulatory Visit (HOSPITAL_COMMUNITY): Payer: PPO | Admitting: Anesthesiology

## 2016-09-04 ENCOUNTER — Encounter (HOSPITAL_COMMUNITY)
Admission: RE | Disposition: A | Discharge status: Home / Self Care | Payer: Self-pay | Source: Ambulatory Visit | Attending: General Surgery

## 2016-09-04 ENCOUNTER — Encounter (HOSPITAL_COMMUNITY): Payer: Self-pay | Admitting: *Deleted

## 2016-09-04 ENCOUNTER — Observation Stay (HOSPITAL_COMMUNITY)
Admission: RE | Admit: 2016-09-04 | Discharge: 2016-09-05 | Disposition: A | Discharge status: Home / Self Care | Payer: PPO | Source: Ambulatory Visit | Attending: General Surgery | Admitting: General Surgery

## 2016-09-04 DIAGNOSIS — Z9049 Acquired absence of other specified parts of digestive tract: Secondary | ICD-10-CM | POA: Insufficient documentation

## 2016-09-04 DIAGNOSIS — K219 Gastro-esophageal reflux disease without esophagitis: Secondary | ICD-10-CM | POA: Diagnosis not present

## 2016-09-04 DIAGNOSIS — Z79891 Long term (current) use of opiate analgesic: Secondary | ICD-10-CM | POA: Diagnosis not present

## 2016-09-04 DIAGNOSIS — N400 Enlarged prostate without lower urinary tract symptoms: Secondary | ICD-10-CM | POA: Diagnosis not present

## 2016-09-04 DIAGNOSIS — Z79899 Other long term (current) drug therapy: Secondary | ICD-10-CM | POA: Insufficient documentation

## 2016-09-04 DIAGNOSIS — K660 Peritoneal adhesions (postprocedural) (postinfection): Secondary | ICD-10-CM | POA: Diagnosis not present

## 2016-09-04 DIAGNOSIS — I10 Essential (primary) hypertension: Secondary | ICD-10-CM | POA: Insufficient documentation

## 2016-09-04 DIAGNOSIS — M545 Low back pain: Secondary | ICD-10-CM | POA: Diagnosis not present

## 2016-09-04 DIAGNOSIS — G8929 Other chronic pain: Secondary | ICD-10-CM | POA: Insufficient documentation

## 2016-09-04 DIAGNOSIS — K432 Incisional hernia without obstruction or gangrene: Principal | ICD-10-CM | POA: Diagnosis present

## 2016-09-04 DIAGNOSIS — M109 Gout, unspecified: Secondary | ICD-10-CM | POA: Diagnosis not present

## 2016-09-04 DIAGNOSIS — K5720 Diverticulitis of large intestine with perforation and abscess without bleeding: Secondary | ICD-10-CM | POA: Diagnosis not present

## 2016-09-04 HISTORY — PX: INCISIONAL HERNIA REPAIR: SHX193

## 2016-09-04 HISTORY — PX: INSERTION OF MESH: SHX5868

## 2016-09-04 SURGERY — REPAIR, HERNIA, INCISIONAL
Anesthesia: General | Site: Abdomen

## 2016-09-04 MED ORDER — CHLORHEXIDINE GLUCONATE CLOTH 2 % EX PADS: 6.0000 | MEDICATED_PAD | Freq: Once | CUTANEOUS | Status: DC

## 2016-09-04 MED ORDER — METHOCARBAMOL 500 MG PO TABS: 500.0000 mg | ORAL_TABLET | Freq: Four times a day (QID) | ORAL | Status: DC | PRN

## 2016-09-04 MED ORDER — BUPIVACAINE-EPINEPHRINE 0.25% -1:200000 IJ SOLN
INTRAMUSCULAR | Status: DC | PRN
  Administered 2016-09-04: 30 mL

## 2016-09-04 MED ORDER — SODIUM CHLORIDE 0.9 % IJ SOLN
INTRAMUSCULAR | Status: DC | PRN
  Administered 2016-09-04: 50 mL

## 2016-09-04 MED ORDER — MORPHINE SULFATE (PF) 2 MG/ML IV SOLN: 1.0000 mg | INTRAVENOUS | Status: DC | PRN

## 2016-09-04 MED ORDER — CARVEDILOL 12.5 MG PO TABS
12.5000 mg | ORAL_TABLET | Freq: Two times a day (BID) | ORAL | Status: DC
  Administered 2016-09-04 – 2016-09-05 (×2): 12.5 mg via ORAL
  Filled 2016-09-04 (×2): qty 1

## 2016-09-04 MED ORDER — SCOPOLAMINE 1 MG/3DAYS TD PT72
MEDICATED_PATCH | TRANSDERMAL | Status: AC
  Filled 2016-09-04: qty 1

## 2016-09-04 MED ORDER — LOSARTAN POTASSIUM 50 MG PO TABS
50.0000 mg | ORAL_TABLET | Freq: Every day | ORAL | Status: DC
  Administered 2016-09-05: 50 mg via ORAL
  Filled 2016-09-04: qty 1

## 2016-09-04 MED ORDER — FENTANYL CITRATE (PF) 100 MCG/2ML IJ SOLN
25.0000 ug | INTRAMUSCULAR | Status: DC | PRN
  Administered 2016-09-04 (×2): 50 ug via INTRAVENOUS

## 2016-09-04 MED ORDER — SODIUM CHLORIDE 0.9 % IJ SOLN
INTRAMUSCULAR | Status: AC
  Filled 2016-09-04: qty 50

## 2016-09-04 MED ORDER — KETOROLAC TROMETHAMINE 15 MG/ML IJ SOLN: 15.0000 mg | Freq: Three times a day (TID) | INTRAMUSCULAR | Status: DC | PRN

## 2016-09-04 MED ORDER — BUPIVACAINE LIPOSOME 1.3 % IJ SUSP
20.0000 mL | Freq: Once | INTRAMUSCULAR | Status: AC
  Administered 2016-09-04: 20 mL
  Filled 2016-09-04: qty 20

## 2016-09-04 MED ORDER — GABAPENTIN 300 MG PO CAPS
300.0000 mg | ORAL_CAPSULE | ORAL | Status: AC
  Administered 2016-09-04: 300 mg via ORAL
  Filled 2016-09-04: qty 1

## 2016-09-04 MED ORDER — EPHEDRINE SULFATE 50 MG/ML IJ SOLN
INTRAMUSCULAR | Status: DC | PRN
  Administered 2016-09-04: 20 mg via INTRAVENOUS
  Administered 2016-09-04 (×2): 10 mg via INTRAVENOUS

## 2016-09-04 MED ORDER — ONDANSETRON 4 MG PO TBDP: 4.0000 mg | ORAL_TABLET | Freq: Four times a day (QID) | ORAL | Status: DC | PRN

## 2016-09-04 MED ORDER — FENTANYL CITRATE (PF) 100 MCG/2ML IJ SOLN
INTRAMUSCULAR | Status: DC | PRN
  Administered 2016-09-04 (×2): 50 ug via INTRAVENOUS

## 2016-09-04 MED ORDER — ACETAMINOPHEN 500 MG PO TABS
1000.0000 mg | ORAL_TABLET | ORAL | Status: AC
  Administered 2016-09-04: 1000 mg via ORAL
  Filled 2016-09-04: qty 2

## 2016-09-04 MED ORDER — DIPHENHYDRAMINE HCL 12.5 MG/5ML PO ELIX: 12.5000 mg | ORAL_SOLUTION | Freq: Four times a day (QID) | ORAL | Status: DC | PRN

## 2016-09-04 MED ORDER — FENTANYL CITRATE (PF) 100 MCG/2ML IJ SOLN
INTRAMUSCULAR | Status: AC
  Administered 2016-09-04: 50 ug via INTRAVENOUS
  Filled 2016-09-04: qty 4

## 2016-09-04 MED ORDER — IBUPROFEN 200 MG PO TABS: 600.0000 mg | ORAL_TABLET | Freq: Four times a day (QID) | ORAL | Status: DC | PRN

## 2016-09-04 MED ORDER — TAMSULOSIN HCL 0.4 MG PO CAPS
0.4000 mg | ORAL_CAPSULE | Freq: Every day | ORAL | Status: DC
  Administered 2016-09-04: 0.4 mg via ORAL
  Filled 2016-09-04: qty 1

## 2016-09-04 MED ORDER — ENOXAPARIN SODIUM 40 MG/0.4ML ~~LOC~~ SOLN: 40.0000 mg | SUBCUTANEOUS | Status: DC

## 2016-09-04 MED ORDER — PROPOFOL 10 MG/ML IV BOLUS
INTRAVENOUS | Status: DC | PRN
  Administered 2016-09-04: 180 mg via INTRAVENOUS

## 2016-09-04 MED ORDER — KCL IN DEXTROSE-NACL 20-5-0.45 MEQ/L-%-% IV SOLN
INTRAVENOUS | Status: DC
  Administered 2016-09-04 – 2016-09-05 (×2): via INTRAVENOUS
  Filled 2016-09-04 (×4): qty 1000

## 2016-09-04 MED ORDER — SUGAMMADEX SODIUM 200 MG/2ML IV SOLN
INTRAVENOUS | Status: DC | PRN
  Administered 2016-09-04: 200 mg via INTRAVENOUS

## 2016-09-04 MED ORDER — SCOPOLAMINE 1 MG/3DAYS TD PT72
1.0000 | MEDICATED_PATCH | Freq: Once | TRANSDERMAL | Status: DC
  Administered 2016-09-04: 1.5 mg via TRANSDERMAL

## 2016-09-04 MED ORDER — FENTANYL CITRATE (PF) 100 MCG/2ML IJ SOLN
INTRAMUSCULAR | Status: AC
  Filled 2016-09-04: qty 2

## 2016-09-04 MED ORDER — ZOLPIDEM TARTRATE 5 MG PO TABS: 5.0000 mg | ORAL_TABLET | Freq: Every evening | ORAL | Status: DC | PRN

## 2016-09-04 MED ORDER — DEXAMETHASONE SODIUM PHOSPHATE 10 MG/ML IJ SOLN
INTRAMUSCULAR | Status: AC
  Filled 2016-09-04: qty 1

## 2016-09-04 MED ORDER — ROCURONIUM BROMIDE 100 MG/10ML IV SOLN
INTRAVENOUS | Status: DC | PRN
  Administered 2016-09-04: 20 mg via INTRAVENOUS
  Administered 2016-09-04: 10 mg via INTRAVENOUS
  Administered 2016-09-04: 50 mg via INTRAVENOUS
  Administered 2016-09-04: 10 mg via INTRAVENOUS

## 2016-09-04 MED ORDER — LACTATED RINGERS IV SOLN
INTRAVENOUS | Status: DC
  Administered 2016-09-04: 10:00:00 via INTRAVENOUS

## 2016-09-04 MED ORDER — ACETAMINOPHEN 500 MG PO TABS
1000.0000 mg | ORAL_TABLET | Freq: Four times a day (QID) | ORAL | Status: DC
  Administered 2016-09-04 – 2016-09-05 (×3): 1000 mg via ORAL
  Filled 2016-09-04 (×4): qty 2

## 2016-09-04 MED ORDER — BUPIVACAINE-EPINEPHRINE (PF) 0.25% -1:200000 IJ SOLN
INTRAMUSCULAR | Status: AC
  Filled 2016-09-04: qty 60

## 2016-09-04 MED ORDER — LIDOCAINE HCL (CARDIAC) 20 MG/ML IV SOLN
INTRAVENOUS | Status: DC | PRN
  Administered 2016-09-04: 100 mg via INTRAVENOUS

## 2016-09-04 MED ORDER — OXYCODONE HCL 5 MG PO TABS
5.0000 mg | ORAL_TABLET | ORAL | Status: DC | PRN
  Administered 2016-09-04: 5 mg via ORAL
  Administered 2016-09-05: 10 mg via ORAL
  Filled 2016-09-04: qty 1
  Filled 2016-09-04: qty 2

## 2016-09-04 MED ORDER — ONDANSETRON HCL 4 MG/2ML IJ SOLN
INTRAMUSCULAR | Status: AC
  Filled 2016-09-04: qty 2

## 2016-09-04 MED ORDER — 0.9 % SODIUM CHLORIDE (POUR BTL) OPTIME
TOPICAL | Status: DC | PRN
  Administered 2016-09-04: 1000 mL

## 2016-09-04 MED ORDER — DOCUSATE SODIUM 100 MG PO CAPS
100.0000 mg | ORAL_CAPSULE | Freq: Two times a day (BID) | ORAL | Status: DC
  Administered 2016-09-04 – 2016-09-05 (×2): 100 mg via ORAL
  Filled 2016-09-04 (×2): qty 1

## 2016-09-04 MED ORDER — OXYCODONE HCL ER 20 MG PO T12A
20.0000 mg | EXTENDED_RELEASE_TABLET | Freq: Two times a day (BID) | ORAL | Status: DC
  Administered 2016-09-04 – 2016-09-05 (×2): 20 mg via ORAL
  Filled 2016-09-04 (×2): qty 1

## 2016-09-04 MED ORDER — DULOXETINE HCL 30 MG PO CPEP
60.0000 mg | ORAL_CAPSULE | Freq: Every day | ORAL | Status: DC
  Administered 2016-09-05: 60 mg via ORAL
  Filled 2016-09-04: qty 2

## 2016-09-04 MED ORDER — SIMETHICONE 80 MG PO CHEW: 40.0000 mg | CHEWABLE_TABLET | Freq: Four times a day (QID) | ORAL | Status: DC | PRN

## 2016-09-04 MED ORDER — ONDANSETRON HCL 4 MG/2ML IJ SOLN: 4.0000 mg | Freq: Four times a day (QID) | INTRAMUSCULAR | Status: DC | PRN

## 2016-09-04 MED ORDER — DIPHENHYDRAMINE HCL 50 MG/ML IJ SOLN: 12.5000 mg | Freq: Four times a day (QID) | INTRAMUSCULAR | Status: DC | PRN

## 2016-09-04 MED ORDER — DEXAMETHASONE SODIUM PHOSPHATE 10 MG/ML IJ SOLN
INTRAMUSCULAR | Status: DC | PRN
  Administered 2016-09-04: 10 mg via INTRAVENOUS

## 2016-09-04 MED ORDER — ONDANSETRON HCL 4 MG/2ML IJ SOLN
INTRAMUSCULAR | Status: DC | PRN
  Administered 2016-09-04: 4 mg via INTRAVENOUS

## 2016-09-04 SURGICAL SUPPLY — 44 items
APPLIER CLIP LOGIC TI 5 (MISCELLANEOUS) IMPLANT
BINDER ABDOMINAL 12 ML 46-62 (SOFTGOODS) IMPLANT
CABLE HIGH FREQUENCY MONO STRZ (ELECTRODE) ×2 IMPLANT
CHLORAPREP W/TINT 26ML (MISCELLANEOUS) ×2 IMPLANT
COVER SURGICAL LIGHT HANDLE (MISCELLANEOUS) ×2 IMPLANT
DECANTER SPIKE VIAL GLASS SM (MISCELLANEOUS) ×2 IMPLANT
DERMABOND ADVANCED (GAUZE/BANDAGES/DRESSINGS)
DERMABOND ADVANCED .7 DNX12 (GAUZE/BANDAGES/DRESSINGS) IMPLANT
DEVICE SECURE STRAP 25 ABSORB (INSTRUMENTS) ×4 IMPLANT
DEVICE TROCAR PUNCTURE CLOSURE (ENDOMECHANICALS) ×2 IMPLANT
DRAIN CHANNEL 19F RND (DRAIN) IMPLANT
DRAPE INCISE IOBAN 66X45 STRL (DRAPES) IMPLANT
DRAPE UTILITY XL STRL (DRAPES) IMPLANT
ELECT PENCIL ROCKER SW 15FT (MISCELLANEOUS) IMPLANT
ELECT REM PT RETURN 15FT ADLT (MISCELLANEOUS) ×2 IMPLANT
EVACUATOR SILICONE 100CC (DRAIN) IMPLANT
GOWN STRL REUS W/TWL XL LVL3 (GOWN DISPOSABLE) ×6 IMPLANT
IRRIG SUCT STRYKERFLOW 2 WTIP (MISCELLANEOUS)
IRRIGATION SUCT STRKRFLW 2 WTP (MISCELLANEOUS) IMPLANT
KIT BASIN OR (CUSTOM PROCEDURE TRAY) ×2 IMPLANT
L-HOOK LAP DISP 36CM (ELECTROSURGICAL)
LHOOK LAP DISP 36CM (ELECTROSURGICAL) IMPLANT
MARKER SKIN DUAL TIP RULER LAB (MISCELLANEOUS) ×2 IMPLANT
MESH VENTRALIGHT ST 6X8 (Mesh Specialty) ×1 IMPLANT
MESH VENTRLGHT ELLIPSE 8X6XMFL (Mesh Specialty) ×1 IMPLANT
NEEDLE SPNL 22GX3.5 QUINCKE BK (NEEDLE) ×2 IMPLANT
PAD POSITIONING PINK XL (MISCELLANEOUS) IMPLANT
SCISSORS LAP 5X35 DISP (ENDOMECHANICALS) ×2 IMPLANT
SHEARS HARMONIC ACE PLUS 36CM (ENDOMECHANICALS) IMPLANT
SLEEVE XCEL OPT CAN 5 100 (ENDOMECHANICALS) ×2 IMPLANT
STRIP CLOSURE SKIN 1/2X4 (GAUZE/BANDAGES/DRESSINGS) ×2 IMPLANT
SUT ETHILON 2 0 PS N (SUTURE) IMPLANT
SUT MNCRL AB 4-0 PS2 18 (SUTURE) ×2 IMPLANT
SUT NOVA 1 T20/GS 25DT (SUTURE) ×2 IMPLANT
SUT NOVA NAB DX-16 0-1 5-0 T12 (SUTURE) ×2 IMPLANT
SUT NOVA NAB GS-21 0 18 T12 DT (SUTURE) IMPLANT
TOWEL OR 17X26 10 PK STRL BLUE (TOWEL DISPOSABLE) ×2 IMPLANT
TOWEL OR NON WOVEN STRL DISP B (DISPOSABLE) ×2 IMPLANT
TRAY FOLEY W/METER SILVER 14FR (SET/KITS/TRAYS/PACK) IMPLANT
TRAY FOLEY W/METER SILVER 16FR (SET/KITS/TRAYS/PACK) ×2 IMPLANT
TRAY LAPAROSCOPIC (CUSTOM PROCEDURE TRAY) ×2 IMPLANT
TROCAR BLADELESS OPT 5 100 (ENDOMECHANICALS) ×2 IMPLANT
TROCAR XCEL NON-BLD 11X100MML (ENDOMECHANICALS) IMPLANT
TUBING INSUF HEATED (TUBING) ×2 IMPLANT

## 2016-09-04 NOTE — Anesthesia Procedure Notes (Signed)
Procedure Name: Intubation Date/Time: 09/04/2016 11:54 AM Performed by: Glory Buff Pre-anesthesia Checklist: Patient identified, Emergency Drugs available, Suction available and Patient being monitored Patient Re-evaluated:Patient Re-evaluated prior to induction Oxygen Delivery Method: Circle system utilized Preoxygenation: Pre-oxygenation with 100% oxygen Induction Type: IV induction Ventilation: Mask ventilation without difficulty Laryngoscope Size: Mac and 4 Grade View: Grade I Tube type: Oral Tube size: 7.5 mm Number of attempts: 1 Airway Equipment and Method: Stylet and Oral airway Placement Confirmation: ETT inserted through vocal cords under direct vision,  positive ETCO2 and breath sounds checked- equal and bilateral Secured at: 22 cm Tube secured with: Tape Dental Injury: Teeth and Oropharynx as per pre-operative assessment

## 2016-09-04 NOTE — Transfer of Care (Signed)
Immediate Anesthesia Transfer of Care Note  Patient: Jeffery Harvey  Procedure(s) Performed: Procedure(s): LAPAROSCOPIC REPAIR INCISIONAL HERNIA WITH MESH (N/A) INSERTION OF MESH (N/A)  Patient Location: PACU  Anesthesia Type:General  Level of Consciousness: awake, alert  and oriented  Airway & Oxygen Therapy: Patient Spontanous Breathing and Patient connected to face mask oxygen  Post-op Assessment: Report given to RN and Post -op Vital signs reviewed and stable  Post vital signs: Reviewed and stable  Last Vitals:  Vitals:   09/04/16 0929  BP: (!) 150/92  Pulse: 68  Resp: 18  Temp: (!) 36.4 C    Last Pain:  Vitals:   09/04/16 0929  TempSrc: Oral      Patients Stated Pain Goal: 3 (54/27/06 2376)  Complications: No apparent anesthesia complications

## 2016-09-04 NOTE — H&P (Signed)
Jeffery Harvey 08/30/2016 8:59 AM Location: [facility] Patient #: [id number] DOB: 1946/01/09 Married / Language: English / Race: White Male   History of Present Illness Jeffery Harvey; 08/30/2016 9:45 AM) The patient is a 71 year old male who presents with an incisional hernia. He comes in today because of concerns of an incisional hernia. He underwent laparoscopic converted to open exploration with small bowel resection due to perforated small bowel diverticulitis on September 28, 2015. He states a few months ago he noticed a bulge just above and slightly to the left of the umbilicus. He also has noticed a small bulge to the right. He feels that it is increased in size. He denies any nausea, vomiting, constipation. He does have sometimes irregular stools due to him no longer having a gallbladder. The hernia has never been hard or firm to touch. He reports a good appetite. He denies any chest pain, chest pressure, shortness of breath, dyspnea on exertion, orthopnea, TIAs and amaurosis fugax. He does not smoke. He is still taking OxyContin for chronic back pain.   Problem List/Past Medical Jeffery Hiss M. [name], Harvey; 08/30/2016 9:46 AM) Jeffery Harvey HERNIA, WITHOUT OBSTRUCTION OR GANGRENE (K43.2)   Past Surgical History Jeffery Hiss M. [name], Harvey; 08/30/2016 9:46 AM) Spinal Surgery - Neck  Resection of Small Bowel  Vasectomy  Laparoscopic Inguinal Hernia Surgery  Bilateral. Appendectomy  Gallbladder Surgery - Laparoscopic  Colon Polyp Removal - Colonoscopy   Diagnostic Studies History Jeffery Hiss M. [name], Harvey; 08/30/2016 9:46 AM) Colonoscopy  1-5 years ago  Allergies Jeffery Harvey, Jeffery Harvey; 08/30/2016 8:59 AM) Allopurinol *CHEMICALS*  AmLODIPine Bes+SyrSpend SF *CALCIUM CHANNEL BLOCKERS*  CeFAZolin in D5W *CEPHALOSPORINS*  DEXLANSOPRAZOLE  Flagyl *ANTI-INFECTIVE AGENTS - MISC.*  Penicillins  Rofecoxib *ANALGESICS - ANTI-INFLAMMATORY*   Medication History Jeffery Harvey, Jeffery Harvey; 08/30/2016 9:00 AM) OxyMORphone HCl ER (20MG  Tab 12HR Deter, Oral) Active. Carvedilol (12.5MG  Tablet, Oral) Active. DULoxetine HCl (30MG  Capsule DR Part, Oral) Active. Losartan Potassium (50MG  Tablet, Oral) Active. Uloric (80MG  Tablet, Oral) Active. Zantac 150 Maximum Strength (150MG  Tablet, Oral) Active. Tamsulosin HCl (0.4MG  Capsule, Oral) Active. Aleve (220MG  Capsule, Oral) Active. Medications Reconciled  Social History Jeffery Hiss M. [name], Harvey; 08/30/2016 9:46 AM) Caffeine use  Carbonated beverages, Coffee, Tea. Alcohol use  Remotely quit alcohol use. Tobacco use  Never smoker. No drug use   Family History Jeffery Hiss M. [name], Harvey; 08/30/2016 9:46 AM) Alcohol Abuse  Father. Colon Cancer  Mother. Arthritis  Mother, Sister. Heart Disease  Mother. Colon Polyps  Mother.  Other Problems Jeffery Hiss M. [name], Harvey; 08/30/2016 9:46 AM) Transfusion history  Hemorrhoids  Other disease, cancer, significant illness  Inguinal Hernia  Cholelithiasis  Gastroesophageal Reflux Disease  Diverticulosis  Arthritis  HYPERTENSION, ESSENTIAL (I10)  CHRONIC MIDLINE LOW BACK PAIN WITHOUT SCIATICA (M54.5)     Review of Systems Jeffery Harvey; 08/30/2016 9:46 AM) General Present- Fatigue and Weight Loss. Not Present- Appetite Loss, Chills, Fever, Night Sweats and Weight Gain. Skin Present- Rash. Not Present- Change in Wart/Mole, Dryness, Hives, Jaundice, New Lesions, Non-Healing Wounds and Ulcer. HEENT Present- Hearing Loss and Wears glasses/contact lenses. Not Present- Earache, Hoarseness, Nose Bleed, Oral Ulcers, Ringing in the Ears, Seasonal Allergies, Sinus Pain, Sore Throat, Visual Disturbances and Yellow Eyes. Respiratory Present- Snoring. Not Present- Bloody sputum, Chronic Cough, Difficulty Breathing and Wheezing. Breast Not Present- Breast Mass, Breast Pain, Nipple Discharge and Skin Changes. Cardiovascular Not Present- Chest Pain, Difficulty Breathing Lying  Down, Leg Cramps, Palpitations, Rapid Heart Rate, Shortness of Breath and Swelling  of Extremities. Gastrointestinal Present- Hemorrhoids. Not Present- Abdominal Pain, Bloating, Bloody Stool, Change in Bowel Habits, Chronic diarrhea, Constipation, Difficulty Swallowing, Excessive gas, Gets full quickly at meals, Indigestion, Nausea, Rectal Pain and Vomiting. Male Genitourinary Present- Frequency. Not Present- Blood in Urine, Change in Urinary Stream, Impotence, Nocturia, Painful Urination, Urgency and Urine Leakage. Musculoskeletal Present- Back Pain. Not Present- Joint Pain, Joint Stiffness, Muscle Pain, Muscle Weakness and Swelling of Extremities. Neurological Not Present- Decreased Memory, Fainting, Headaches, Numbness, Seizures, Tingling, Tremor, Trouble walking and Weakness. Psychiatric Not Present- Anxiety, Bipolar, Change in Sleep Pattern, Depression, Fearful and Frequent crying. Endocrine Not Present- Cold Intolerance, Excessive Hunger, Hair Changes, Heat Intolerance and New Diabetes. Hematology Present- Easy Bruising. Not Present- Blood Thinners, Excessive bleeding, Gland problems, HIV and Persistent Infections.  Vitals Jeffery Harvey Jeffery Harvey; 08/30/2016 9:00 AM) 08/30/2016 9:00 AM Weight: 220.6 lb Height: 74.5in Body Surface Area: 2.28 m Body Mass Index: 27.94 kg/m  Temp.: 18F  Pulse: 64 (Regular)  BP: 140/90 (Sitting, Left Arm, Standard)       Physical Exam Jeffery Harvey; 08/30/2016 9:44 AM) General Mental Status-Alert. General Appearance-Cooperative. Orientation-Oriented X4. Posture-Normal posture.  Integumentary Global Assessment Upon inspection and palpation of skin surfaces of the - Head/Face: no rashes, ulcers, lesions or evidence of photo damage. No palpable nodules or masses and Neck: no visible lesions or palpable masses.  Head and Neck Head-normocephalic, atraumatic with no lesions or palpable masses. Face Global Assessment -  atraumatic. Thyroid Gland Characteristics - normal size and consistency.  Eye Eyeball - Bilateral-Extraocular movements intact. Sclera/Conjunctiva - Bilateral-No scleral icterus, No Discharge.  ENMT Nose and Sinuses External Inspection of the Nose - no deformities observed, no swelling present.  Chest and Lung Exam Palpation Palpation of the chest reveals - Non-tender. Auscultation Breath sounds - Normal.  Cardiovascular Auscultation Rhythm - Regular. Heart Sounds - S1 WNL and S2 WNL. Carotid arteries - No Carotid bruit.  Abdomen Inspection Inspection of the abdomen reveals - No Visible peristalsis, No Abnormal pulsations and No Paradoxical movements. Palpation/Percussion Palpation and Percussion of the abdomen reveal - Soft, Non Tender, No Rebound tenderness, No Rigidity (guarding), No hepatosplenomegaly and No Palpable abdominal masses. Note: Old upper midline incision. There is a palpable fascial defect at the inferior aspect of the incision just around the umbilicus more to the left there is also a defect just to the right as well. Fascial defect to the left feels to be about 2-3 cm. The one just to the right is about 1-1/2 cm. Easily reducible. Nontender   Peripheral Vascular Upper Extremity Palpation - Pulses bilaterally normal. Lower Extremity Palpation - Edema - Bilateral - No edema.  Neurologic Neurologic evaluation reveals -normal sensation and normal coordination.  Neuropsychiatric Mental status exam performed with findings of-able to articulate well with normal speech/language, rate, volume and coherence and thought content normal with ability to perform basic computations and apply abstract reasoning.  Musculoskeletal Normal Exam - Bilateral-Upper Extremity Strength Normal and Lower Extremity Strength Normal.    Assessment & Plan Jeffery Harvey; 08/30/2016 9:43 AM) Jeffery Harvey HERNIA, WITHOUT OBSTRUCTION OR GANGRENE (K43.2) Impression: We  discussed the etiology of ventral incisional hernias. We discussed the signs and symptoms of incarceration and strangulation. The patient was given educational material. I also drew diagrams.  We discussed nonoperative and operative management. With respect to operative management, we discussed both open repair and laparoscopic repair. We discussed the pros and cons of each approach. I discussed the typical aftercare with each procedure and how  each procedure differs. We also discussed his goals of hernia surgery which is mainly to prevent incarceration and strangulation  The patient has elected to proceed with laparoscopic repair of incisional hernia with mesh  We discussed the risk and benefits of surgery including but not limited to bleeding, infection, injury to surrounding structures, hernia recurrence, mesh complications, hematoma/seroma formation, need to convert to an open procedure, blood clot formation, urinary retention, post operative ileus, general anesthesia risk, long-term abdominal pain. We discussed that this procedure can be quite uncomfortable and difficult to recover from based on how the mesh is secured to the abdominal wall. We discussed the importance of avoiding heavy lifting and straining for a period of 6 weeks.  I will call him with the results of the CT scan. If his fascial defect this very large We made discussed changing to an open approach but I think that wont be the case Current Plans Pt Education - Pamphlet Given - Hernia Surgery: discussed with patient and provided information. You are being scheduled for surgery- Our schedulers will call you.  You should hear from our office's scheduling department within 5 working days about the location, date, and time of surgery. We try to make accommodations for patient's preferences in scheduling surgery, but sometimes the OR schedule or the surgeon's schedule prevents [place] from making those accommodations.  If you have not  heard from our office [phone]) in 5 working days, call the office and ask for your surgeon's nurse.  If you have other questions about your diagnosis, plan, or surgery, call the office and ask for your surgeon's nurse.  We will get a CT scan of her abdomen to delineate you are abdominal wall anatomy  HYPERTENSION, ESSENTIAL (I10) CHRONIC MIDLINE LOW BACK PAIN WITHOUT SCIATICA (M54.5) Impression: On chronic narcotics. No signs of abuse   Jeffery Ruff. [name], Harvey, Jeffery Harvey General, Bariatric, & Minimally Invasive Surgery [facility], [place]

## 2016-09-04 NOTE — Anesthesia Postprocedure Evaluation (Signed)
Anesthesia Post Note  Patient: Jeffery Harvey  Procedure(s) Performed: Procedure(s) (LRB): LAPAROSCOPIC REPAIR INCISIONAL HERNIA WITH MESH (N/A) INSERTION OF MESH (N/A)     Patient location during evaluation: PACU Anesthesia Type: General Level of consciousness: awake Pain management: pain level controlled Vital Signs Assessment: post-procedure vital signs reviewed and stable Respiratory status: spontaneous breathing Cardiovascular status: stable Anesthetic complications: no    Last Vitals:  Vitals:   09/04/16 0929  BP: (!) 150/92  Pulse: 68  Resp: 18  Temp: (!) 36.4 C    Last Pain:  Vitals:   09/04/16 0929  TempSrc: Oral                 Lelia Jons

## 2016-09-04 NOTE — Brief Op Note (Signed)
09/04/2016  1:46 PM  PATIENT:  [name]  71 y.o. male  PRE-OPERATIVE DIAGNOSIS:  incisional hernia  POST-OPERATIVE DIAGNOSIS:  incisional hernia  PROCEDURE:  Procedure(s): LAPAROSCOPIC REPAIR INCISIONAL HERNIA WITH MESH (N/A) INSERTION OF MESH (N/A)  SURGEON:  Surgeon(s) and Role:    Greer Pickerel, MD - Primary  Type of repair - mesh  (choices - primary suture, mesh, or component)  Name of mesh - Bard Ventralight ST  Size of mesh - Length 15 cm, Width 20 cm  Mesh overlap - 5 cm  Placement of mesh - beneath fascia and into peritoneal cavity   PHYSICIAN ASSISTANT:   ASSISTANTS: none   ANESTHESIA:   general  EBL:  Total I/O In: 900 [I.V.:900] Out: 25 [Blood:25]  BLOOD ADMINISTERED:none  DRAINS: none   LOCAL MEDICATIONS USED:  OTHER exparel  SPECIMEN:  No Specimen  DISPOSITION OF SPECIMEN:  n/a  COUNTS:  YES  TOURNIQUET:  * No tourniquets in log *  DICTATION: .Other Dictation: Dictation Number [id number]  PLAN OF CARE: Admit for overnight observation  PATIENT DISPOSITION:  PACU - hemodynamically stable.   Delay start of Pharmacological VTE agent (>24hrs) due to surgical blood loss or risk of bleeding: no  [name]. [name], MD, FACS General, Bariatric, & Minimally Invasive Surgery [facility], [place]

## 2016-09-04 NOTE — Anesthesia Preprocedure Evaluation (Addendum)
Anesthesia Evaluation  Patient identified by MRN, date of birth, ID band Patient awake    Reviewed: Allergy & Precautions, NPO status , Patient's Chart, lab work & pertinent test results  History of Anesthesia Complications (+) PONV  Airway Mallampati: II  TM Distance: >3 FB     Dental   Pulmonary neg pulmonary ROS,    breath sounds clear to auscultation       Cardiovascular hypertension,  Rhythm:Regular Rate:Normal     Neuro/Psych    GI/Hepatic negative GI ROS, Neg liver ROS,   Endo/Other  negative endocrine ROS  Renal/GU negative Renal ROS     Musculoskeletal  (+) Arthritis ,   Abdominal   Peds  Hematology   Anesthesia Other Findings   Reproductive/Obstetrics                             Anesthesia Physical Anesthesia Plan  ASA: III  Anesthesia Plan: General   Post-op Pain Management:    Induction:   PONV Risk Score and Plan: 4 or greater and Ondansetron, Dexamethasone, Propofol, Midazolam and Scopolamine patch - Pre-op  Airway Management Planned: Oral ETT  Additional Equipment:   Intra-op Plan:   Post-operative Plan: Extubation in OR  Informed Consent: I have reviewed the patients History and Physical, chart, labs and discussed the procedure including the risks, benefits and alternatives for the proposed anesthesia with the patient or authorized representative who has indicated his/her understanding and acceptance.   Dental advisory given  Plan Discussed with: CRNA and Anesthesiologist  Anesthesia Plan Comments:         Anesthesia Quick Evaluation

## 2016-09-04 NOTE — Op Note (Signed)
TRAJAN GROVE [id number] 1945-09-19 09/04/2016   Laparoscopic Ventral Incisional Hernia Repair with Mesh Procedure Note  Indications: Symptomatic ventral incisional hernia  Pre-operative Diagnosis: ventral incisional hernia  Post-operative Diagnosis: same  Surgeon: Gayland Curry   Assistants: none  Anesthesia: General endotracheal anesthesia  Procedure Details  The patient was seen in the Holding Room. The risks, benefits, complications, treatment options, and expected outcomes were discussed with the patient. The possibilities of reaction to medication, pulmonary aspiration, perforation of viscus, bleeding, recurrent infection, the need for additional procedures, failure to diagnose a condition, and creating a complication requiring transfusion or operation were discussed with the patient. The patient concurred with the proposed plan, giving informed consent.  The site of surgery properly noted/marked. The patient was taken to the operating room, identified as Jeffery Harvey and the procedure verified as laparoscopic ventral hernia repair with mesh. A Time Out was held and the above information confirmed.    The patient was placed supine.  After establishing general anesthesia, a Foley catheter was placed.  The abdomen was prepped with Chloraprep and draped in standard fashion.  A 5 mm Optiview was used the cannulate the peritoneal cavity in the left upper quadrant below the costal margin.  Pneumoperitoneum was obtained by insufflating CO2, maintaining a maximum pressure of 15 mmHg.  The 5 mm 30-degree laparoscopic was inserted.  There were some omental adhesions to the anterior abdominal wall in and around the hernia defect and epigastric and LUQ.  An 11-mm port was placed in the left anterior axillary line at the level of the umbilicus.  Another 5-mm port was placed in the left lower quadrant.  Endoshears with/without cautery and gentle traction were used to dissect the omental adhesions  away from the anterior abdominal wall.  We cleared the entire abdominal wall and were able to visualize 1 fascial defects - mainly at level of umbilicus and to the left.  We used a spinal needle to identify the extent of the hernia defects. This covered an area of about 9 cm wide by 6 cm tall. exparel was infiltrated in bilateral upper abdominal wall laterally as a TAP block and where the transfascial sutures would be coming thru the abdominal wall.    We selected a 15 x 20 cm oblong piece of Bard VentralightST mesh.  We 8 placed stay sutures of 0 Novofil around the edges of the mesh.  The mesh was then rolled up and inserted through the 11 mm port site.  The mesh was then unrolled. It was oriented so that it was 20 cm horiztonal by 15 cm vertical.  The stay sutures were then pulled up through small stab incisions using the Endo-close device.  This deployed the mesh widely over the fascial defects.  The stay sutures were then tied down.  The Secure Strap device was then used to tack down the edges of the mesh at 1 cm intervals circumferentially.  We placed a few tacks inside the outer ring of tacks.  We inspected for hemostasis.   Pneumoperitoneum was then released as we removed the remainder of the trocars.  The port sites were closed with 4-0 Monocryl.  All of the incisions and stay suture sites were then sealed with benzoin, steri-strip, and bandages.  An abdominal binder was placed around the patient's abdomen.  Foley removed. The patient was extubated and brought to the recovery room in stable condition.  All sponge, instrument, and needle counts were correct prior to closure and at the  conclusion of the case.   Findings: Type of repair - mesh  (choices - primary suture, mesh, or component)  Name of mesh - bard ventralightST  Size of mesh - Length 15 cm, Width 20 cm  Mesh overlap - >5 cm  Placement of mesh - beneath fascia and into peritoneal cavity  (choices - beneath fascia and into peritoneal  cavity, beneath fascia but external to peritoneal cavity, between the muscle and fascia, above or external to fascia)    Estimated Blood Loss:  Minimal         Complications:  None; patient tolerated the procedure well.         Disposition: PACU - hemodynamically stable.         Condition: stable  Jeffery Harvey. Jeffery Pulling, MD, FACS General, Bariatric, & Minimally Invasive Surgery [facility], [place]

## 2016-09-05 DIAGNOSIS — K432 Incisional hernia without obstruction or gangrene: Secondary | ICD-10-CM | POA: Diagnosis not present

## 2016-09-05 LAB — CBC
HEMATOCRIT: 39.6 % (ref 39.0–52.0)
HEMOGLOBIN: 13.8 g/dL (ref 13.0–17.0)
MCH: 30.5 pg (ref 26.0–34.0)
MCHC: 34.8 g/dL (ref 30.0–36.0)
MCV: 87.4 fL (ref 78.0–100.0)
Platelets: 127 10*3/uL — ABNORMAL LOW (ref 150–400)
RBC: 4.53 MIL/uL (ref 4.22–5.81)
RDW: 12.6 % (ref 11.5–15.5)
WBC: 13 10*3/uL — AB (ref 4.0–10.5)

## 2016-09-05 LAB — BASIC METABOLIC PANEL
Anion gap: 9 (ref 5–15)
BUN: 9 mg/dL (ref 6–20)
CHLORIDE: 103 mmol/L (ref 101–111)
CO2: 26 mmol/L (ref 22–32)
Calcium: 9 mg/dL (ref 8.9–10.3)
Creatinine, Ser: 0.79 mg/dL (ref 0.61–1.24)
GFR calc non Af Amer: >60 mL/min (ref >60)
Glucose, Bld: 125 mg/dL — ABNORMAL HIGH (ref 65–99)
POTASSIUM: 4.7 mmol/L (ref 3.5–5.1)
SODIUM: 138 mmol/L (ref 135–145)

## 2016-09-05 MED ORDER — OXYCODONE HCL 5 MG PO TABS: 5.0000 mg | ORAL_TABLET | ORAL | 0 refills | Status: DC | PRN

## 2016-09-05 NOTE — Care Management Note (Signed)
Case Management Note  Patient Details  Name: Jeffery Harvey MRN: [id number] Date of Birth: 30-Jan-1946  Subjective/Objective:                  Post op-lap. Ventral incisional hernia repair with mesh procedure  Action/Plan: Date:  September 05, 2016 Chart reviewed for concurrent status and case management needs. Will continue to follow patient progress. Discharge Planning: following for needs Expected discharge date: [id number] Velva Harman, BSN, [place], [place]  Expected Discharge Date:                  Expected Discharge Plan:  Home/Self Care  In-House Referral:     Discharge planning Services  CM Consult  Post Acute Care Choice:    Choice offered to:     DME Arranged:    DME Agency:     HH Arranged:    HH Agency:     Status of Service:  In process, will continue to follow  If discussed at Long Length of Stay Meetings, dates discussed:    Additional Comments:  Leeroy Cha, RN 09/05/2016, 8:49 AM

## 2016-09-05 NOTE — Progress Notes (Signed)
Pts IV removed with a clean and dry dressing intact. Pt denies pain at the time of d/c with no s/s of distress noted. Pt taken to the front entrance via wheel chair with nursing staff and family present. 

## 2016-09-05 NOTE — Discharge Instructions (Signed)
OUR PHONE SYSTEM WILL BE DOWN FOR 2 HRS THIS EVENING (8-10PM), PLEASE CHECK OUR [email or web address] OR OUR FACEBOOK PAGE OR GOOGLE  FOR THE BACKUP NUMBER DURING THIS TIME  [facility], [place]   HERNIA REPAIR: POST OP INSTRUCTIONS  Always review your discharge instruction sheet given to you by the facility where your surgery was performed. IF YOU HAVE DISABILITY OR FAMILY LEAVE FORMS, YOU MUST BRING THEM TO THE OFFICE FOR PROCESSING.   DO NOT GIVE THEM TO YOUR DOCTOR.  1. A  prescription for pain medication may be given to you upon discharge.  Take your pain medication as prescribed, if needed.  If narcotic pain medicine is not needed, then you may take acetaminophen (Tylenol) as needed. 2. Take your usually prescribed medications unless otherwise directed. 3. If you need a refill on your pain medication, please contact your pharmacy.  They will contact our office to request authorization. Prescriptions will not be filled after 5 pm or on week-ends. 4. You should follow a light diet the first 24 hours after arrival home, such as soup and crackers, etc.  Be sure to include lots of fluids daily.  Resume your normal diet the day after surgery. 5. Most patients will experience some swelling and bruising around the umbilicus or in the groin and scrotum.  Ice packs and reclining will help.  Swelling and bruising can take several days to resolve.  6. It is common to experience some constipation if taking pain medication after surgery.  Increasing fluid intake and taking a stool softener (such as Colace) will usually help or prevent this problem from occurring.  A mild laxative (Milk of Magnesia or Miralax) should be taken according to package directions if there are no bowel movements after 48 hours. 7. Unless discharge instructions indicate otherwise, you may remove your bandages 48 hours after surgery, and you may shower at that time.  You may have steri-strips (small skin  tapes) in place directly over the incision.  These strips should be left on the skin for 7-10 days.  If your surgeon used skin glue on the incision, you may shower in 24 hours.  The glue will flake off over the next 2-3 weeks.  Any sutures or staples will be removed at the office during your follow-up visit. 8. ACTIVITIES:  You may resume regular (light) daily activities beginning the next day--such as daily self-care, walking, climbing stairs--gradually increasing activities as tolerated.  You may have sexual intercourse when it is comfortable.  Refrain from any heavy lifting or straining until approved by your doctor. a. You may drive when you are no longer taking prescription pain medication, you can comfortably wear a seatbelt, and you can safely maneuver your car and apply brakes. b. RETURN TO WORK:  9. You should see your doctor in the office for a follow-up appointment approximately 2-3 weeks after your surgery.  Make sure that you call for this appointment within a day or two after you arrive home to insure a convenient appointment time. 10. OTHER INSTRUCTIONS: DO NOT LIFT, PUSH, OR PULL ANYTHING GREATER THAN 15 LBS FOR 6 WEEKS.     WHEN TO CALL YOUR DOCTOR: 1. Fever over 101.0 2. Inability to urinate 3. Nausea and/or vomiting 4. Extreme swelling or bruising 5. Continued bleeding from incision. 6. Increased pain, redness, or drainage from the incision  The clinic staff is available to answer your questions during regular business hours.  Please dont hesitate to call and ask to  speak to one of the nurses for clinical concerns.  If you have a medical emergency, go to the nearest emergency room or call 911.  A surgeon from [facility] is always on call at the hospital   [address] [place], [place], [place]  [address] ?  [address], [place],    [address] [phone] ? [phone] ? FAX [phone] Web site: [email or web address]

## 2016-09-06 NOTE — Discharge Summary (Signed)
Physician Discharge Summary  Jeffery Harvey BJS:[id number] DOB: Sep 10, 1945 DOA: 09/04/2016  PCP: Chesley Noon, MD  Admit date: 09/04/2016 Discharge date: 09/05/2016  Recommendations for Outpatient Follow-up:  1.   Follow-up Information    Greer Pickerel, MD. Schedule an appointment as soon as possible for a visit in 3 week(s).   Specialty:  General Surgery Why:  3-4 weeks for post op check Contact information: [address] [place] [address] [phone]          Discharge Diagnoses:  1. Incisional hernia 2. Chronic back pain 3. HTN 4. BPH  Surgical Procedure: Laparoscopic repair of incisional hernia with mesh  Discharge Condition: good Disposition: home  Diet recommendation: cardiac  Filed Weights   09/04/16 1007  Weight: 99.8 kg (220 lb)    History of present illness:  The patient is a 71 year old male who presents with an incisional hernia. He comes in today because of concerns of an incisional hernia. He underwent laparoscopic converted to open exploration with small bowel resection due to perforated small bowel diverticulitis on September 28, 2015. He states a few months ago he noticed a bulge just above and slightly to the left of the umbilicus. He also has noticed a small bulge to the right. He feels that it is increased in size. He denies any nausea, vomiting, constipation. He does have sometimes irregular stools due to him no longer having a gallbladder. The hernia has never been hard or firm to touch. He reports a good appetite. He denies any chest pain, chest pressure, shortness of breath, dyspnea on exertion, orthopnea, TIAs and amaurosis fugax. He does not smoke. He is still taking OxyContin for chronic back pain.  Hospital Course:  He came in for planned laparoscopic repair of incisional hernia with mesh.  He was kept overnight for observation.  He did well.  On postoperative day 1 he was tolerating a diet.  He had voided without  difficulty.  He had had a bowel movement.  He had no nausea or vomiting.  His pain was well controlled.  I felt he was stable for discharge.  We reviewed discharge instructions.  BP 110/68 (BP Location: Left Arm)   Pulse 61   Temp 97.7 F (36.5 C) (Oral)   Resp 16   Ht 6' 2.5" (1.892 m)   Wt 99.8 kg (220 lb)   SpO2 95%   BMI 27.87 kg/m   Gen: alert, NAD, non-toxic appearing Pupils: equal, no scleral icterus Pulm: Lungs clear to auscultation, symmetric chest rise CV: regular rate and rhythm Abd: soft, mild approp tender, nondistended.  No cellulitis. No incisional hernia Ext: no edema, no calf tenderness Skin: no rash, no jaundice  Discharge Instructions  Discharge Instructions    Call MD for:    Complete by:  As directed    Temperature >101   Call MD for:  hives    Complete by:  As directed    Call MD for:  persistant dizziness or light-headedness    Complete by:  As directed    Call MD for:  persistant nausea and vomiting    Complete by:  As directed    Call MD for:  redness, tenderness, or signs of infection (pain, swelling, redness, odor or green/yellow discharge around incision site)    Complete by:  As directed    Call MD for:  severe uncontrolled pain    Complete by:  As directed    Diet - low sodium  heart healthy    Complete by:  As directed    Discharge instructions    Complete by:  As directed    See [facility] discharge instructions   Increase activity slowly    Complete by:  As directed      Allergies as of 09/05/2016      Reactions   Flagyl [metronidazole] Hives   Reported by patient's wife   Penicillins Hives   Has patient had a PCN reaction causing immediate rash, facial/tongue/throat swelling, SOB or lightheadedness with hypotension: Yes Has patient had a PCN reaction causing severe rash involving mucus membranes or skin necrosis: No Has patient had a PCN reaction that required hospitalization No Has patient had a PCN reaction occurring within the last  10 years: Yes If all of the above answers are "NO", then may proceed with Cephalosporin use. HAS tolerated meropenem (09/2015)   Allopurinol Swelling   Amlodipine Besy-benazepril Hcl    UNKNOWN   Ancef [cefazolin] Hives   Dexilant [dexlansoprazole] Nausea And Vomiting   Vioxx [rofecoxib] Swelling      Medication List    TAKE these medications   carvedilol 12.5 MG tablet Commonly known as:  COREG TAKE 1 TABLET TWICE DAILY   docusate sodium 100 MG capsule Commonly known as:  COLACE Take 100 mg by mouth daily.   DULoxetine 60 MG capsule Commonly known as:  CYMBALTA Take 60 mg by mouth daily.   losartan 50 MG tablet Commonly known as:  COZAAR Take 1 tablet (50 mg total) by mouth daily.   naproxen sodium 220 MG tablet Commonly known as:  ANAPROX Take 440 mg by mouth daily as needed (for pain.).   OXYCONTIN 20 mg 12 hr tablet Generic drug:  oxyCODONE Take 20 mg by mouth every 12 (twelve) hours. What changed:  Another medication with the same name was added. Make sure you understand how and when to take each.   oxyCODONE 5 MG immediate release tablet Commonly known as:  Oxy IR/ROXICODONE Take 1-2 tablets (5-10 mg total) by mouth every 4 (four) hours as needed for moderate pain. What changed:  You were already taking a medication with the same name, and this prescription was added. Make sure you understand how and when to take each.   ranitidine 150 MG tablet Commonly known as:  ZANTAC Take 150 mg by mouth daily as needed for heartburn.   tamsulosin 0.4 MG Caps capsule Commonly known as:  FLOMAX Take 0.4 mg by mouth daily after supper.   ULORIC 80 MG Tabs Generic drug:  Febuxostat Take 40 mg by mouth every evening.   zolpidem 10 MG tablet Commonly known as:  AMBIEN Take 10 mg by mouth at bedtime.      Follow-up Information    Greer Pickerel, MD. Schedule an appointment as soon as possible for a visit in 3 week(s).   Specialty:  General Surgery Why:  3-4 weeks for  post op check Contact information: [place] [address] [phone]            The results of significant diagnostics from this hospitalization (including imaging, microbiology, ancillary and laboratory) are listed below for reference.    Significant Diagnostic Studies: Ct Abdomen Pelvis W Contrast  Result Date: 08/30/2016 CLINICAL DATA:  Incisional hernia post small bowel surgery and diverticulitis in August 0350, umbilical hernia, history hypertension EXAM: CT ABDOMEN AND PELVIS WITH CONTRAST TECHNIQUE: Multidetector CT imaging of the abdomen and pelvis was performed using the standard  protocol following bolus administration of intravenous contrast. Sagittal and coronal MPR images reconstructed from axial data set. Creatinine was obtained on site at [facility] at [address] Results: Creatinine 0.7 mg/dL. CONTRAST:  185mL ISOVUE-300 IOPAMIDOL (ISOVUE-300) INJECTION 61% IV. No oral contrast administered. COMPARISON:  None FINDINGS: Lower chest: Lung bases clear Hepatobiliary: Tiny focus of pneumobilia, consistent with patient history of ERCP. Post cholecystectomy. No focal hepatic abnormalities. No biliary dilatation. Pancreas: Atrophic, otherwise unremarkable Spleen: Normal appearance Adrenals/Urinary Tract: Small cyst upper pole LEFT kidney. Adrenal glands, kidneys, ureters, and bladder otherwise normal appearance. Stomach/Bowel: Appendix surgically absent by history. Diverticulosis of sigmoid colon without evidence of diverticulitis. Single minimally prominent small bowel loop in the LEFT mid abdomen, 4.2 cm diameter adjacent to an anastomotic staple line without definite evidence of obstruction; this could represent physiologic dilatation post surgery. Several small bowel loops extend into an umbilical hernia without evidence of obstruction. Stomach and remaining bowel loops otherwise normal appearance. Vascular/Lymphatic: Atherosclerotic  calcifications aorta, iliac arteries, and coronary arteries. Reproductive: N/A Other: No free air or free fluid. LEFT inguinal hernia containing fat. Umbilical hernia as above. No acute inflammatory process. Musculoskeletal: Unremarkable IMPRESSION: Sigmoid diverticulosis without evidence of diverticulitis. Umbilical hernia containing nonobstructed small bowel loops. Tiny foci of pneumobilia consistent with prior ERCP and sphincterotomy. Single segment of minimally distended small bowel in the LEFT mid abdomen without evidence of obstruction, likely postsurgical in origin. LEFT inguinal hernia containing fat. Coronary artery disease. No acute intra-abdominal or intrapelvic abnormalities. Aortic Atherosclerosis (ICD10-I70.0). Electronically Signed   By: Lavonia Dana M.D.   On: 08/30/2016 14:05    Microbiology: No results found for this or any previous visit (from the past 240 hour(s)).   Labs: Basic Metabolic Panel:  Recent Labs Lab 08/31/16 1332 09/05/16 0746  NA 141 138  K 5.8* 4.7  CL 105 103  CO2 29 26  GLUCOSE 90 125*  BUN 15 9  CREATININE 0.92 0.79  CALCIUM 9.5 9.0   Liver Function Tests:  Recent Labs Lab 08/31/16 1332  AST 35  ALT 23  ALKPHOS 72  BILITOT 1.5*  PROT 7.0  ALBUMIN 4.7   No results for input(s): LIPASE, AMYLASE in the last 168 hours. No results for input(s): AMMONIA in the last 168 hours. CBC:  Recent Labs Lab 08/31/16 1332 09/05/16 0746  WBC 6.0 13.0*  NEUTROABS 3.9  --   HGB 15.2 13.8  HCT 42.8 39.6  MCV 87.0 87.4  PLT 142* 127*   Cardiac Enzymes: No results for input(s): CKTOTAL, CKMB, CKMBINDEX, TROPONINI in the last 168 hours. BNP: BNP (last 3 results) No results for input(s): BNP in the last 8760 hours.  ProBNP (last 3 results) No results for input(s): PROBNP in the last 8760 hours.  CBG: No results for input(s): GLUCAP in the last 168 hours.  Active Problems:   Incisional hernia   Time coordinating discharge: 20  min  Signed:  Gayland Curry, MD [facility], [place] [phone] 09/06/2016, 3:48 PM

## 2016-09-12 DIAGNOSIS — R61 Generalized hyperhidrosis: Secondary | ICD-10-CM | POA: Diagnosis not present

## 2016-10-26 DIAGNOSIS — M4696 Unspecified inflammatory spondylopathy, lumbar region: Secondary | ICD-10-CM | POA: Diagnosis not present

## 2016-10-26 DIAGNOSIS — M48061 Spinal stenosis, lumbar region without neurogenic claudication: Secondary | ICD-10-CM | POA: Diagnosis not present

## 2016-10-26 DIAGNOSIS — M533 Sacrococcygeal disorders, not elsewhere classified: Secondary | ICD-10-CM | POA: Diagnosis not present

## 2016-10-29 DIAGNOSIS — I10 Essential (primary) hypertension: Secondary | ICD-10-CM | POA: Diagnosis not present

## 2016-10-29 DIAGNOSIS — F5101 Primary insomnia: Secondary | ICD-10-CM | POA: Diagnosis not present

## 2016-10-29 DIAGNOSIS — G8929 Other chronic pain: Secondary | ICD-10-CM | POA: Diagnosis not present

## 2016-10-29 DIAGNOSIS — E785 Hyperlipidemia, unspecified: Secondary | ICD-10-CM | POA: Diagnosis not present

## 2016-10-29 DIAGNOSIS — Z23 Encounter for immunization: Secondary | ICD-10-CM | POA: Diagnosis not present

## 2016-10-29 DIAGNOSIS — R7301 Impaired fasting glucose: Secondary | ICD-10-CM | POA: Diagnosis not present

## 2016-10-29 DIAGNOSIS — M545 Low back pain: Secondary | ICD-10-CM | POA: Diagnosis not present

## 2017-01-25 DIAGNOSIS — M533 Sacrococcygeal disorders, not elsewhere classified: Secondary | ICD-10-CM | POA: Diagnosis not present

## 2017-01-25 DIAGNOSIS — M5136 Other intervertebral disc degeneration, lumbar region: Secondary | ICD-10-CM | POA: Diagnosis not present

## 2017-01-25 DIAGNOSIS — M1A09X0 Idiopathic chronic gout, multiple sites, without tophus (tophi): Secondary | ICD-10-CM | POA: Diagnosis not present

## 2017-01-25 DIAGNOSIS — M48061 Spinal stenosis, lumbar region without neurogenic claudication: Secondary | ICD-10-CM | POA: Diagnosis not present

## 2017-01-25 DIAGNOSIS — M150 Primary generalized (osteo)arthritis: Secondary | ICD-10-CM | POA: Diagnosis not present

## 2017-02-22 DIAGNOSIS — I10 Essential (primary) hypertension: Secondary | ICD-10-CM | POA: Diagnosis not present

## 2017-02-23 LAB — LIPID PANEL
CHOL/HDL RATIO: 4.7 ratio (ref 0.0–5.0)
Cholesterol, Total: 146 mg/dL (ref 100–199)
HDL: 31 mg/dL — ABNORMAL LOW (ref >39)
LDL Calculated: 67 mg/dL (ref 0–99)
Triglycerides: 239 mg/dL — ABNORMAL HIGH (ref 0–149)
VLDL Cholesterol Cal: 48 mg/dL — ABNORMAL HIGH (ref 5–40)

## 2017-02-23 LAB — BASIC METABOLIC PANEL
BUN / CREAT RATIO: 14 (ref 10–24)
BUN: 11 mg/dL (ref 8–27)
CO2: 23 mmol/L (ref 20–29)
CREATININE: 0.81 mg/dL (ref 0.76–1.27)
Calcium: 9.8 mg/dL (ref 8.6–10.2)
Chloride: 105 mmol/L (ref 96–106)
GFR, EST AFRICAN AMERICAN: 103 mL/min/{1.73_m2} (ref >59)
GFR, EST NON AFRICAN AMERICAN: 89 mL/min/{1.73_m2} (ref >59)
Glucose: 89 mg/dL (ref 65–99)
Potassium: 4.6 mmol/L (ref 3.5–5.2)
Sodium: 144 mmol/L (ref 134–144)

## 2017-02-23 LAB — CBC WITH DIFFERENTIAL/PLATELET
BASOS: 2 %
Basophils Absolute: 0.1 10*3/uL (ref 0.0–0.2)
EOS (ABSOLUTE): 0.1 10*3/uL (ref 0.0–0.4)
EOS: 3 %
HEMATOCRIT: 43.5 % (ref 37.5–51.0)
HEMOGLOBIN: 14.9 g/dL (ref 13.0–17.7)
Immature Grans (Abs): 0 10*3/uL (ref 0.0–0.1)
Immature Granulocytes: 1 %
LYMPHS ABS: 1.1 10*3/uL (ref 0.7–3.1)
Lymphs: 24 %
MCH: 30.7 pg (ref 26.6–33.0)
MCHC: 34.3 g/dL (ref 31.5–35.7)
MCV: 90 fL (ref 79–97)
Monocytes Absolute: 0.3 10*3/uL (ref 0.1–0.9)
Monocytes: 8 %
NEUTROS ABS: 2.8 10*3/uL (ref 1.4–7.0)
Neutrophils: 62 %
Platelets: 158 10*3/uL (ref 150–379)
RBC: 4.86 x10E6/uL (ref 4.14–5.80)
RDW: 12.9 % (ref 12.3–15.4)
WBC: 4.4 10*3/uL (ref 3.4–10.8)

## 2017-02-23 LAB — HEPATIC FUNCTION PANEL
ALK PHOS: 91 IU/L (ref 39–117)
ALT: 19 IU/L (ref 0–44)
AST: 19 IU/L (ref 0–40)
Albumin: 4.4 g/dL (ref 3.5–4.8)
BILIRUBIN TOTAL: 0.7 mg/dL (ref 0.0–1.2)
BILIRUBIN, DIRECT: 0.17 mg/dL (ref 0.00–0.40)
TOTAL PROTEIN: 6.9 g/dL (ref 6.0–8.5)

## 2017-02-23 NOTE — Progress Notes (Signed)
Jeffery Harvey Date of Birth: 1945-07-31 Medical Record #[id number]  History of Present Illness: Jeffery Harvey is seen  for follow up of HTN. He has HTN, gout and occasional GERD. He has no known CAD. Echocardiogram and Cardiolite studies in May of 2012 are unremarkable.  BP has been well controlled. No complaints of chest pain or SOB.   He underwent cholecystectomy in August 2015 at [facility]. He had ERCP done 2016 by Dr. Paulita Fujita.   In August 2017 he presented with acute abdominal pain. He had small bowel diverticulitis with perforation and underwent partial small bowel resection. He had no complications. In July 2018 he underwent repair of an incisional hernia.   On follow up today he is doing well. He has no cardiac complaints. He is still involved doing  [employer] but is able to set his own schedule.      Current Outpatient Medications on File Prior to Visit  Medication Sig Dispense Refill  . carvedilol (COREG) 12.5 MG tablet TAKE 1 TABLET TWICE DAILY 180 tablet 3  . docusate sodium (COLACE) 100 MG capsule Take 100 mg by mouth daily.    . DULoxetine (CYMBALTA) 60 MG capsule Take 60 mg by mouth daily.    . Febuxostat (ULORIC) 80 MG TABS Take 40 mg by mouth every evening.     . naproxen sodium (ANAPROX) 220 MG tablet Take 440 mg by mouth daily as needed (for pain.).    Marland Kitchen oxyCODONE (OXY IR/ROXICODONE) 5 MG immediate release tablet Take 1-2 tablets (5-10 mg total) by mouth every 4 (four) hours as needed for moderate pain. 30 tablet 0  . oxyCODONE (OXYCONTIN) 20 mg 12 hr tablet Take 20 mg by mouth every 12 (twelve) hours.    . ranitidine (ZANTAC) 150 MG tablet Take 150 mg by mouth daily as needed for heartburn.     . tamsulosin (FLOMAX) 0.4 MG CAPS capsule Take 0.4 mg by mouth daily after supper.    . zolpidem (AMBIEN) 10 MG tablet Take 10 mg by mouth at bedtime.     No current facility-administered medications on file prior to visit.     Allergies  Allergen Reactions  . Flagyl  [Metronidazole] Hives    Reported by patient's wife  . Penicillins Hives    Has patient had a PCN reaction causing immediate rash, facial/tongue/throat swelling, SOB or lightheadedness with hypotension: Yes Has patient had a PCN reaction causing severe rash involving mucus membranes or skin necrosis: No Has patient had a PCN reaction that required hospitalization No Has patient had a PCN reaction occurring within the last 10 years: Yes If all of the above answers are "NO", then may proceed with Cephalosporin use.  HAS tolerated meropenem (09/2015)  . Allopurinol Swelling  . Amlodipine Besy-Benazepril Hcl     UNKNOWN  . Ancef [Cefazolin] Hives  . Dexilant [Dexlansoprazole] Nausea And Vomiting  . Vioxx [Rofecoxib] Swelling    Past Medical History:  Diagnosis Date  . Arthritis    narrowing and disc degeneration of lower back"chroin pain"  . Cancer ([place])    skin cancer  . Chronic back pain    chronic back pain- see pain management MD  . Diverticulitis    had tiny perforation of bowel- 04/2014  . GI bleed 2008  . Gout   . Hypertension   . PONV (postoperative nausea and vomiting)    better with recent surgeries  . Swallowing difficulty    can swallow, but often times has to spit up" bring  up a lot of foamy tough"  . Transfusion history    x2 episodes -post procedure polypectomies at different times under 2 yrs ago    Past Surgical History:  Procedure Laterality Date  . APPENDECTOMY     open,"rupture"  . BOWEL RESECTION N/A 09/29/2015   Procedure: SMALL BOWEL RESECTION;  Surgeon: Greer Pickerel, MD;  Location: [facility];  Service: General;  Laterality: N/A;  . CERVICAL FUSION  2000  . CHOLECYSTECTOMY    . CHOLECYSTECTOMY, LAPAROSCOPIC  8/15  . COLONOSCOPY W/ BIOPSIES AND POLYPECTOMY    . ERCP N/A 06/02/2014   Procedure: ENDOSCOPIC RETROGRADE CHOLANGIOPANCREATOGRAPHY (ERCP);  Surgeon: Arta Silence, MD;  Location: [facility];  Service: Endoscopy;  Laterality: N/A;  . ERCP N/A  06/29/2014   Procedure: ENDOSCOPIC RETROGRADE CHOLANGIOPANCREATOGRAPHY (ERCP);  Surgeon: Arta Silence, MD;  Location: [facility];  Service: Endoscopy;  Laterality: N/A;  . ESOPHAGOGASTRODUODENOSCOPY (EGD) WITH PROPOFOL N/A 08/04/2014   Procedure: ESOPHAGOGASTRODUODENOSCOPY (EGD) WITH PROPOFOL;  Surgeon: Arta Silence, MD;  Location: [facility];  Service: Endoscopy;  Laterality: N/A;  . EUS N/A 05/26/2014   Procedure: FULL UPPER ENDOSCOPIC ULTRASOUND (EUS) RADIAL;  Surgeon: Arta Silence, MD;  Location: [facility];  Service: Endoscopy;  Laterality: N/A;  . EUS N/A 06/29/2014   Procedure: UPPER ENDOSCOPIC ULTRASOUND (EUS) RADIAL;  Surgeon: Arta Silence, MD;  Location: [facility];  Service: Endoscopy;  Laterality: N/A;  . HERNIA REPAIR     Bilateral  . INCISIONAL HERNIA REPAIR N/A 09/04/2016   Procedure: LAPAROSCOPIC REPAIR INCISIONAL HERNIA WITH MESH;  Surgeon: Greer Pickerel, MD;  Location: [facility];  Service: General;  Laterality: N/A;  . INSERTION OF MESH N/A 09/04/2016   Procedure: INSERTION OF MESH;  Surgeon: Greer Pickerel, MD;  Location: [facility];  Service: General;  Laterality: N/A;  . LAPAROSCOPY N/A 09/29/2015   Procedure: LAPAROSCOPY DIAGNOSTIC;  Surgeon: Greer Pickerel, MD;  Location: [facility];  Service: General;  Laterality: N/A;  . LAPAROTOMY N/A 09/29/2015   Procedure: EXPLORATORY LAPAROTOMY;  Surgeon: Greer Pickerel, MD;  Location: [facility];  Service: General;  Laterality: N/A;    Social History   Tobacco Use  Smoking Status Never Smoker  Smokeless Tobacco Never Used    Social History   Substance and Sexual Activity  Alcohol Use No  . Alcohol/week: 2.5 oz  . Types: 5 Standard drinks or equivalent per week   Comment: none in 2 years    Family History  Problem Relation Age of Onset  . Other Father 47       Blood Disorder  . Heart failure Mother 106  . Colon cancer Mother   . Hypertension Sister 6       Valve repaired    Review of Systems: The review of systems is per the  HPI.  All other systems were reviewed and are negative.  Physical Exam: BP 118/82   Pulse (!) 58   Ht 6' 2.5" (1.892 m)   Wt 222 lb 9.6 oz (101 kg)   BMI 28.20 kg/m  GENERAL:  Well appearing HEENT:  PERRL, EOMI, sclera are clear. Oropharynx is clear. NECK:  No jugular venous distention, carotid upstroke brisk and symmetric, no bruits, no thyromegaly or adenopathy LUNGS:  Clear to auscultation bilaterally CHEST:  Unremarkable HEART:  RRR,  PMI not displaced or sustained,S1 and S2 within normal limits, no S3, no S4: no clicks, no rubs, no murmurs ABD:  Soft, nontender. BS +, no masses or bruits. No hepatomegaly, no splenomegaly EXT:  2 + pulses  throughout, no edema, no cyanosis no clubbing SKIN:  Warm and dry.  No rashes NEURO:  Alert and oriented x 3. Cranial nerves II through XII intact. PSYCH:  Cognitively intact     LABORATORY DATA:  Ecg: today shows NSR with normal Ecg. I have personally reviewed and interpreted this study.   Labs done 10/30/15 reviewed. Normal CMET, cholesterol 152, triglycerides 194, HDL 32, LDL 81.   Lab Results  Component Value Date   WBC 4.4 02/22/2017   HGB 14.9 02/22/2017   HCT 43.5 02/22/2017   PLT 158 02/22/2017   GLUCOSE 89 02/22/2017   CHOL 146 02/22/2017   TRIG 239 (H) 02/22/2017   HDL 31 (L) 02/22/2017   LDLCALC 67 02/22/2017   ALT 19 02/22/2017   AST 19 02/22/2017   NA 144 02/22/2017   K 4.6 02/22/2017   CL 105 02/22/2017   CREATININE 0.81 02/22/2017   BUN 11 02/22/2017   CO2 23 02/22/2017     Assessment / Plan: 1. Hypertension- Well controlled.  Continue Coreg and losartan.   2. Elevated triglycerides- he is motivated to do better with his diet.

## 2017-02-25 ENCOUNTER — Ambulatory Visit: Payer: PPO | Admitting: Cardiology

## 2017-02-25 ENCOUNTER — Encounter: Payer: Self-pay | Admitting: Cardiology

## 2017-02-25 VITALS — BP 118/82 | HR 58 | Ht 74.5 in | Wt 222.6 lb

## 2017-02-25 DIAGNOSIS — I10 Essential (primary) hypertension: Secondary | ICD-10-CM | POA: Diagnosis not present

## 2017-02-25 MED ORDER — LOSARTAN POTASSIUM 50 MG PO TABS: 50.0000 mg | ORAL_TABLET | Freq: Every day | ORAL | 3 refills | Status: DC

## 2017-02-25 NOTE — Patient Instructions (Addendum)
Continue your current therapy  I will see you in 6 months.   

## 2017-02-25 NOTE — Addendum Note (Signed)
Addended by: Kathyrn Lass on: 02/25/2017 11:38 AM   Modules accepted: Orders

## 2017-02-27 ENCOUNTER — Other Ambulatory Visit: Payer: Self-pay | Admitting: Cardiology

## 2017-02-27 NOTE — Telephone Encounter (Signed)
Refill already sent to pharmacy 02/25/17.

## 2017-04-23 DIAGNOSIS — M48061 Spinal stenosis, lumbar region without neurogenic claudication: Secondary | ICD-10-CM | POA: Diagnosis not present

## 2017-04-23 DIAGNOSIS — M533 Sacrococcygeal disorders, not elsewhere classified: Secondary | ICD-10-CM | POA: Diagnosis not present

## 2017-05-19 ENCOUNTER — Other Ambulatory Visit: Payer: Self-pay | Admitting: Cardiology

## 2017-07-22 DIAGNOSIS — M533 Sacrococcygeal disorders, not elsewhere classified: Secondary | ICD-10-CM | POA: Diagnosis not present

## 2017-07-22 DIAGNOSIS — M48061 Spinal stenosis, lumbar region without neurogenic claudication: Secondary | ICD-10-CM | POA: Diagnosis not present

## 2017-08-17 ENCOUNTER — Other Ambulatory Visit: Payer: Self-pay | Admitting: Cardiology

## 2017-08-19 ENCOUNTER — Encounter: Payer: Self-pay | Admitting: Cardiology

## 2017-08-26 NOTE — Progress Notes (Signed)
Jeffery Harvey Date of Birth: 1945/06/11 Medical Record #[id number]  History of Present Illness: Jeffery Harvey is seen  for follow up of HTN. He has HTN, gout and occasional GERD. He has no known CAD. Echocardiogram and Cardiolite studies in May of 2012 are unremarkable.    He underwent cholecystectomy in August 2015 at [facility]. He had ERCP done 2016 by Dr. Paulita Fujita.   In August 2017 he presented with acute abdominal pain. He had small bowel diverticulitis with perforation and underwent partial small bowel resection. He had no complications. In July 2018 he underwent repair of an incisional hernia.   On follow up today he is doing well. He has no cardiac complaints. He has been working a lot on his fishing boat.  He is still involved doing renovations but at a reduced schedule. He has been working on his diet better and has lost 13 lbs.     Current Outpatient Medications on File Prior to Visit  Medication Sig Dispense Refill  . carvedilol (COREG) 12.5 MG tablet TAKE 1 TABLET BY MOUTH TWICE DAILY 180 tablet 1  . docusate sodium (COLACE) 100 MG capsule Take 100 mg by mouth daily.    . DULoxetine (CYMBALTA) 60 MG capsule Take 60 mg by mouth daily.    . Febuxostat (ULORIC) 80 MG TABS Take 40 mg by mouth every evening.     Marland Kitchen losartan (COZAAR) 50 MG tablet Take 1 tablet (50 mg total) by mouth daily. 90 tablet 3  . naproxen sodium (ANAPROX) 220 MG tablet Take 440 mg by mouth daily as needed (for pain.).    Marland Kitchen oxyCODONE (OXY IR/ROXICODONE) 5 MG immediate release tablet Take 1-2 tablets (5-10 mg total) by mouth every 4 (four) hours as needed for moderate pain. 30 tablet 0  . oxyCODONE (OXYCONTIN) 20 mg 12 hr tablet Take 20 mg by mouth every 12 (twelve) hours.    . ranitidine (ZANTAC) 150 MG tablet Take 150 mg by mouth daily as needed for heartburn.     . tamsulosin (FLOMAX) 0.4 MG CAPS capsule Take 0.4 mg by mouth daily after supper.    . zolpidem (AMBIEN) 10 MG tablet Take 10 mg by mouth at  bedtime.     No current facility-administered medications on file prior to visit.     Allergies  Allergen Reactions  . Flagyl [Metronidazole] Hives    Reported by patient's wife  . Amlodipine     Other reaction(s): Other  . Penicillins Hives    Has patient had a PCN reaction causing immediate rash, facial/tongue/throat swelling, SOB or lightheadedness with hypotension: Yes Has patient had a PCN reaction causing severe rash involving mucus membranes or skin necrosis: No Has patient had a PCN reaction that required hospitalization No Has patient had a PCN reaction occurring within the last 10 years: Yes If all of the above answers are "NO", then may proceed with Cephalosporin use.  HAS tolerated meropenem (09/2015)  . Allopurinol Swelling  . Amlodipine Besy-Benazepril Hcl     UNKNOWN  . Ancef [Cefazolin] Hives  . Dexilant [Dexlansoprazole] Nausea And Vomiting  . Vioxx [Rofecoxib] Swelling    Past Medical History:  Diagnosis Date  . Arthritis    narrowing and disc degeneration of lower back"chroin pain"  . Cancer ([place])    skin cancer  . Chronic back pain    chronic back pain- see pain management MD  . Diverticulitis    had tiny perforation of bowel- 04/2014  . GI bleed 2008  .  Gout   . Hypertension   . PONV (postoperative nausea and vomiting)    better with recent surgeries  . Swallowing difficulty    can swallow, but often times has to spit up" bring up a lot of foamy tough"  . Transfusion history    x2 episodes -post procedure polypectomies at different times under 2 yrs ago    Past Surgical History:  Procedure Laterality Date  . APPENDECTOMY     open,"rupture"  . BOWEL RESECTION N/A 09/29/2015   Procedure: SMALL BOWEL RESECTION;  Surgeon: Greer Pickerel, MD;  Location: [place];  Service: General;  Laterality: N/A;  . CERVICAL FUSION  2000  . CHOLECYSTECTOMY    . CHOLECYSTECTOMY, LAPAROSCOPIC  8/15  . COLONOSCOPY W/ BIOPSIES AND POLYPECTOMY    . ERCP N/A 06/02/2014    Procedure: ENDOSCOPIC RETROGRADE CHOLANGIOPANCREATOGRAPHY (ERCP);  Surgeon: Arta Silence, MD;  Location: [facility];  Service: Endoscopy;  Laterality: N/A;  . ERCP N/A 06/29/2014   Procedure: ENDOSCOPIC RETROGRADE CHOLANGIOPANCREATOGRAPHY (ERCP);  Surgeon: Arta Silence, MD;  Location: [facility];  Service: Endoscopy;  Laterality: N/A;  . ESOPHAGOGASTRODUODENOSCOPY (EGD) WITH PROPOFOL N/A 08/04/2014   Procedure: ESOPHAGOGASTRODUODENOSCOPY (EGD) WITH PROPOFOL;  Surgeon: Arta Silence, MD;  [facility]: [facility];  Service: Endoscopy;  Laterality: N/A;  . EUS N/A 05/26/2014   Procedure: FULL UPPER ENDOSCOPIC ULTRASOUND (EUS) RADIAL;  Surgeon: Arta Silence, MD;  [facility];  Service: Endoscopy;  Laterality: N/A;  . EUS N/A 06/29/2014   Procedure: UPPER ENDOSCOPIC ULTRASOUND (EUS) RADIAL;  Surgeon: Arta Silence, MD;  [facility];  Service: Endoscopy;  Laterality: N/A;  . HERNIA REPAIR     Bilateral  . INCISIONAL HERNIA REPAIR N/A 09/04/2016   Procedure: LAPAROSCOPIC REPAIR INCISIONAL HERNIA WITH MESH;  Surgeon: Greer Pickerel, MD;  [facility]: [facility];  Service: General;  Laterality: N/A;  . INSERTION OF MESH N/A 09/04/2016   Procedure: INSERTION OF MESH;  Surgeon: Greer Pickerel, MD;  Location: [facility];  Service: General;  Laterality: N/A;  . LAPAROSCOPY N/A 09/29/2015   Procedure: LAPAROSCOPY DIAGNOSTIC;  Surgeon: Greer Pickerel, MD;  Location: [place];  Service: General;  Laterality: N/A;  . LAPAROTOMY N/A 09/29/2015   Procedure: EXPLORATORY LAPAROTOMY;  Surgeon: Greer Pickerel, MD;  Location: [place];  Service: General;  Laterality: N/A;    Social History   Tobacco Use  Smoking Status Never Smoker  Smokeless Tobacco Never Used    Social History   Substance and Sexual Activity  Alcohol Use No  . Alcohol/week: 3.0 oz  . Types: 5 Standard drinks or equivalent per week   Comment: none in 2 years    Family History  Problem Relation Age of Onset  . Other Father 54        Blood Disorder  . Heart failure Mother 30  . Colon cancer Mother   . Hypertension Sister 62       Valve repaired    Review of Systems: The review of systems is per the HPI.  All other systems were reviewed and are negative.  Physical Exam: BP 106/68   Pulse 68   Ht 6' 2.5" (1.892 m)   Wt 209 lb 9.6 oz (95.1 kg)   BMI 26.55 kg/m  GENERAL:  Well appearing HEENT:  PERRL, EOMI, sclera are clear. Oropharynx is clear. NECK:  No jugular venous distention, carotid upstroke brisk and symmetric, no bruits, no thyromegaly or adenopathy LUNGS:  Clear to auscultation bilaterally CHEST:  Unremarkable HEART:  RRR,  PMI not displaced  or sustained,S1 and S2 within normal limits, no S3, no S4: no clicks, no rubs, no murmurs ABD:  Soft, nontender. BS +, no masses or bruits. No hepatomegaly, no splenomegaly EXT:  2 + pulses throughout, no edema, no cyanosis no clubbing SKIN:  Warm and dry.  No rashes NEURO:  Alert and oriented x 3. Cranial nerves II through XII intact. PSYCH:  Cognitively intact     LABORATORY DATA:   Labs done 10/30/15 reviewed. Normal CMET, cholesterol 152, triglycerides 194, HDL 32, LDL 81.   Lab Results  Component Value Date   WBC 4.4 02/22/2017   HGB 14.9 02/22/2017   HCT 43.5 02/22/2017   PLT 158 02/22/2017   GLUCOSE 89 02/22/2017   CHOL 146 02/22/2017   TRIG 239 (H) 02/22/2017   HDL 31 (L) 02/22/2017   LDLCALC 67 02/22/2017   ALT 19 02/22/2017   AST 19 02/22/2017   NA 144 02/22/2017   K 4.6 02/22/2017   CL 105 02/22/2017   CREATININE 0.81 02/22/2017   BUN 11 02/22/2017   CO2 23 02/22/2017     Assessment / Plan: 1. Hypertension- Well controlled.  Continue Coreg and losartan.   2. Elevated triglycerides- doing much better with diet and has lost 13 lbs.

## 2017-08-28 ENCOUNTER — Encounter: Payer: Self-pay | Admitting: Cardiology

## 2017-08-28 ENCOUNTER — Ambulatory Visit: Payer: PPO | Admitting: Cardiology

## 2017-08-28 VITALS — BP 106/68 | HR 68 | Ht 74.5 in | Wt 209.6 lb

## 2017-08-28 DIAGNOSIS — E782 Mixed hyperlipidemia: Secondary | ICD-10-CM | POA: Diagnosis not present

## 2017-08-28 DIAGNOSIS — I10 Essential (primary) hypertension: Secondary | ICD-10-CM | POA: Diagnosis not present

## 2017-08-28 NOTE — Patient Instructions (Addendum)
Continue your current therapy  I will see you in 6 months.   

## 2017-10-09 DIAGNOSIS — R351 Nocturia: Secondary | ICD-10-CM | POA: Diagnosis not present

## 2017-10-09 DIAGNOSIS — Z23 Encounter for immunization: Secondary | ICD-10-CM | POA: Diagnosis not present

## 2017-10-09 DIAGNOSIS — G4700 Insomnia, unspecified: Secondary | ICD-10-CM | POA: Diagnosis not present

## 2017-10-16 DIAGNOSIS — M533 Sacrococcygeal disorders, not elsewhere classified: Secondary | ICD-10-CM | POA: Diagnosis not present

## 2017-10-16 DIAGNOSIS — M48061 Spinal stenosis, lumbar region without neurogenic claudication: Secondary | ICD-10-CM | POA: Diagnosis not present

## 2017-11-29 DIAGNOSIS — G894 Chronic pain syndrome: Secondary | ICD-10-CM | POA: Diagnosis not present

## 2017-11-29 DIAGNOSIS — M533 Sacrococcygeal disorders, not elsewhere classified: Secondary | ICD-10-CM | POA: Diagnosis not present

## 2017-11-29 DIAGNOSIS — M48061 Spinal stenosis, lumbar region without neurogenic claudication: Secondary | ICD-10-CM | POA: Diagnosis not present

## 2018-01-24 DIAGNOSIS — D692 Other nonthrombocytopenic purpura: Secondary | ICD-10-CM | POA: Diagnosis not present

## 2018-01-24 DIAGNOSIS — D225 Melanocytic nevi of trunk: Secondary | ICD-10-CM | POA: Diagnosis not present

## 2018-01-24 DIAGNOSIS — I788 Other diseases of capillaries: Secondary | ICD-10-CM | POA: Diagnosis not present

## 2018-01-24 DIAGNOSIS — L814 Other melanin hyperpigmentation: Secondary | ICD-10-CM | POA: Diagnosis not present

## 2018-01-24 DIAGNOSIS — K130 Diseases of lips: Secondary | ICD-10-CM | POA: Diagnosis not present

## 2018-01-24 DIAGNOSIS — L7211 Pilar cyst: Secondary | ICD-10-CM | POA: Diagnosis not present

## 2018-01-24 DIAGNOSIS — D171 Benign lipomatous neoplasm of skin and subcutaneous tissue of trunk: Secondary | ICD-10-CM | POA: Diagnosis not present

## 2018-01-24 DIAGNOSIS — L821 Other seborrheic keratosis: Secondary | ICD-10-CM | POA: Diagnosis not present

## 2018-01-24 DIAGNOSIS — D2261 Melanocytic nevi of right upper limb, including shoulder: Secondary | ICD-10-CM | POA: Diagnosis not present

## 2018-01-24 DIAGNOSIS — D2272 Melanocytic nevi of left lower limb, including hip: Secondary | ICD-10-CM | POA: Diagnosis not present

## 2018-01-24 DIAGNOSIS — D1801 Hemangioma of skin and subcutaneous tissue: Secondary | ICD-10-CM | POA: Diagnosis not present

## 2018-01-24 DIAGNOSIS — L570 Actinic keratosis: Secondary | ICD-10-CM | POA: Diagnosis not present

## 2018-01-27 DIAGNOSIS — M1A09X0 Idiopathic chronic gout, multiple sites, without tophus (tophi): Secondary | ICD-10-CM | POA: Diagnosis not present

## 2018-01-27 DIAGNOSIS — M150 Primary generalized (osteo)arthritis: Secondary | ICD-10-CM | POA: Diagnosis not present

## 2018-01-27 DIAGNOSIS — Z6828 Body mass index (BMI) 28.0-28.9, adult: Secondary | ICD-10-CM | POA: Diagnosis not present

## 2018-01-27 DIAGNOSIS — E663 Overweight: Secondary | ICD-10-CM | POA: Diagnosis not present

## 2018-01-27 DIAGNOSIS — M5136 Other intervertebral disc degeneration, lumbar region: Secondary | ICD-10-CM | POA: Diagnosis not present

## 2018-02-14 ENCOUNTER — Other Ambulatory Visit: Payer: Self-pay | Admitting: Cardiology

## 2018-02-26 DIAGNOSIS — M533 Sacrococcygeal disorders, not elsewhere classified: Secondary | ICD-10-CM | POA: Diagnosis not present

## 2018-02-26 DIAGNOSIS — M48061 Spinal stenosis, lumbar region without neurogenic claudication: Secondary | ICD-10-CM | POA: Diagnosis not present

## 2018-03-10 ENCOUNTER — Other Ambulatory Visit: Payer: Self-pay | Admitting: Cardiology

## 2018-03-10 MED ORDER — LOSARTAN POTASSIUM 50 MG PO TABS: 50.0000 mg | ORAL_TABLET | Freq: Every day | ORAL | 0 refills | Status: DC

## 2018-03-10 NOTE — Telephone Encounter (Signed)
 *  STAT* If patient is at the pharmacy, call can be transferred to refill team.   1. Which medications need to be refilled? (please list name of each medication and dose if known) losartan (COZAAR) 50 MG tablet  2. Which pharmacy/location (including street and city if local pharmacy) is medication to be sent to? [facility] [place] [facility]  3. Do they need a 30 day or 90 day supply? [place]

## 2018-03-30 IMAGING — CR DG ABD PORTABLE 1V
1 series · 1 of 1 positions shown · non-contrast
Comparison: Prior abdominal radiographs 08/03/2014

CLINICAL DATA: 69-year-old male undergoing nasogastric tube
placement

EXAM:
PORTABLE ABDOMEN - 1 VIEW

[AP]
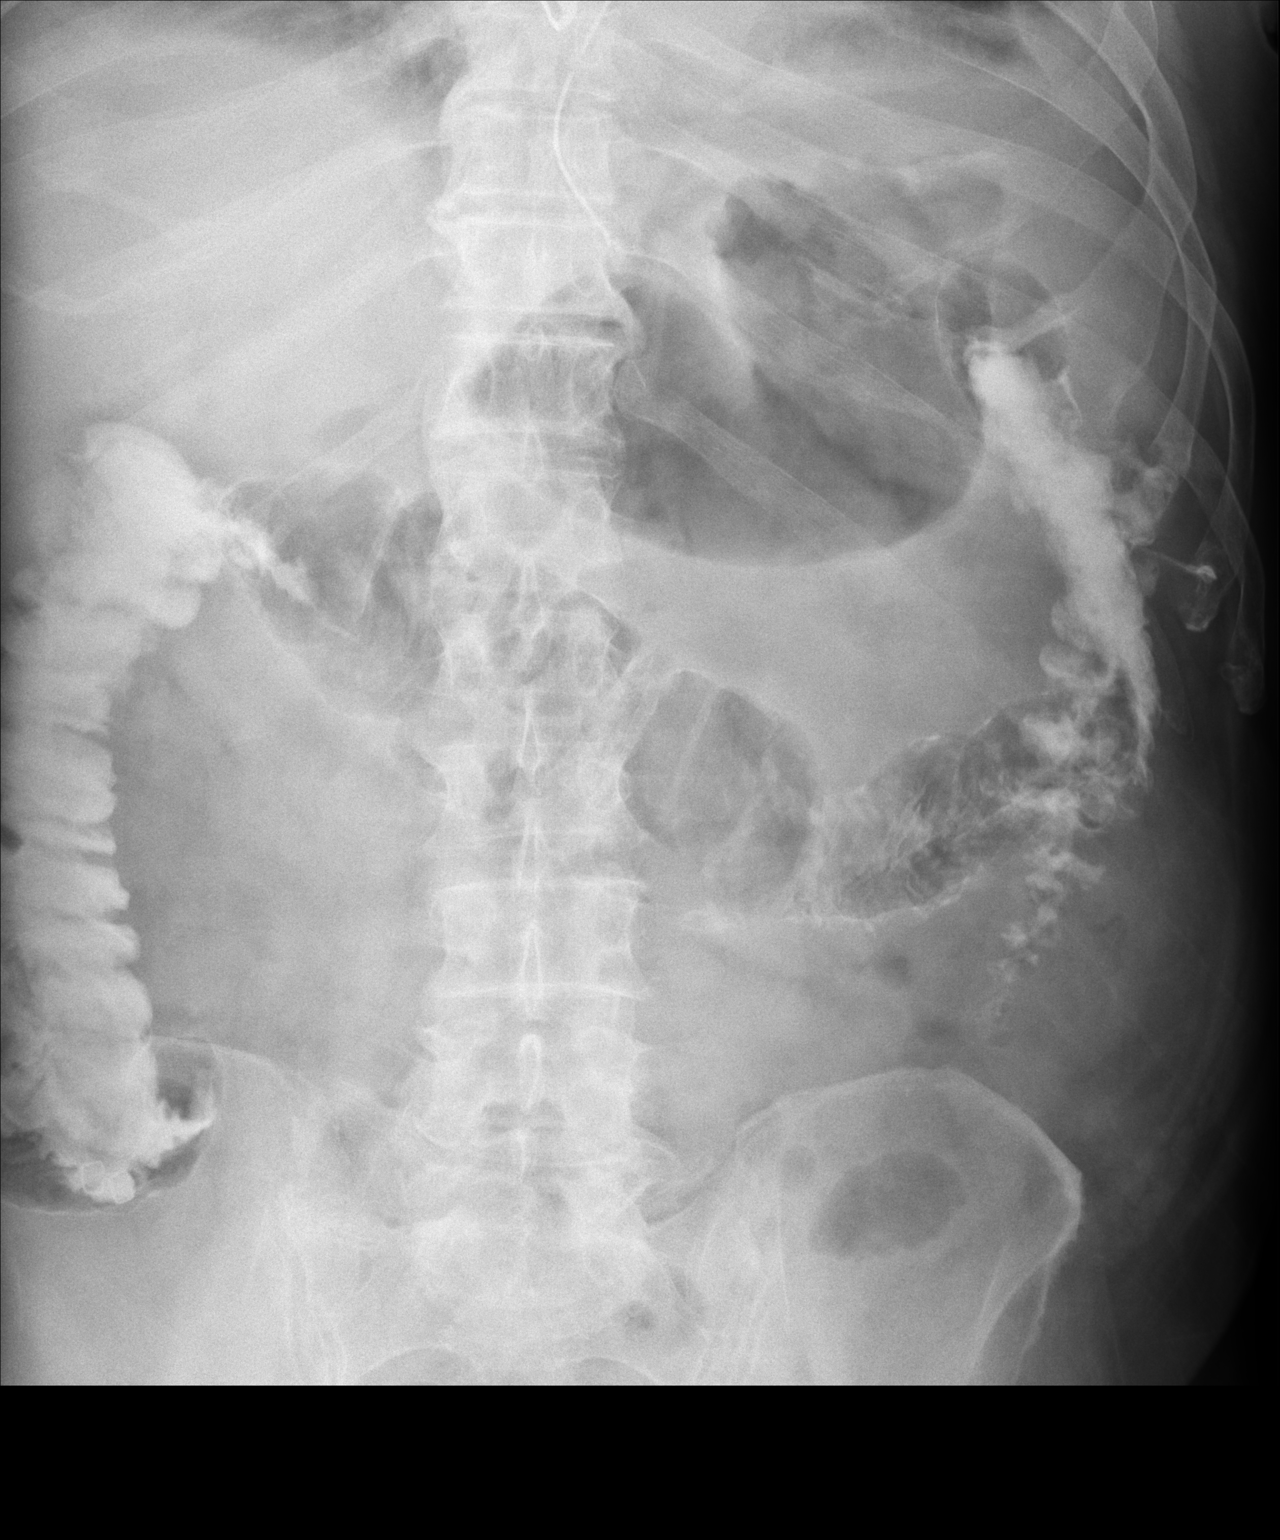

[1 of 1 positions shown; findings below may reference images not displayed]

FINDINGS: The tip of the nasogastric tube projects over the region of the
gastroesophageal junction. The bowel gas pattern is not obstructed.
Residual oral contrast material is present in the colon. No acute
osseous abnormality.
IMPRESSION: The tip of the nasogastric tube overlies the gastroesophageal
junction. Recommend advancing for more optimal position within the
gastric lumen.

## 2018-04-07 ENCOUNTER — Other Ambulatory Visit: Payer: Self-pay

## 2018-04-07 MED ORDER — LOSARTAN POTASSIUM 50 MG PO TABS: 50.0000 mg | ORAL_TABLET | Freq: Every day | ORAL | 1 refills | Status: DC

## 2018-04-07 NOTE — Telephone Encounter (Signed)
Rx(s) sent to pharmacy electronically.  

## 2018-05-19 DIAGNOSIS — M533 Sacrococcygeal disorders, not elsewhere classified: Secondary | ICD-10-CM | POA: Diagnosis not present

## 2018-05-19 DIAGNOSIS — M48061 Spinal stenosis, lumbar region without neurogenic claudication: Secondary | ICD-10-CM | POA: Diagnosis not present

## 2018-06-09 ENCOUNTER — Telehealth: Payer: Self-pay | Admitting: *Deleted

## 2018-06-09 NOTE — Telephone Encounter (Signed)
06/09/2018  LMOM @ 2:01pm,re:follow up appointment. D.Tremell Reimers

## 2018-06-12 NOTE — Telephone Encounter (Signed)
F/U Message          Patient is retuning this call for an appt.

## 2018-06-12 NOTE — Telephone Encounter (Signed)
LMOM

## 2018-07-17 DIAGNOSIS — M48061 Spinal stenosis, lumbar region without neurogenic claudication: Secondary | ICD-10-CM | POA: Diagnosis not present

## 2018-07-17 DIAGNOSIS — M533 Sacrococcygeal disorders, not elsewhere classified: Secondary | ICD-10-CM | POA: Diagnosis not present

## 2018-08-05 ENCOUNTER — Telehealth: Payer: Self-pay | Admitting: Adult Health

## 2018-08-05 NOTE — Telephone Encounter (Signed)
smartphone/ consent/ my chart/ pre reg completed °

## 2018-08-09 NOTE — Progress Notes (Signed)
Virtual Visit via Video Note   This visit type was conducted due to national recommendations for restrictions regarding the COVID-19 Pandemic (e.g. social distancing) in an effort to limit this patient's exposure and mitigate transmission in our community.  Due to his co-morbid illnesses, this patient is at least at moderate risk for complications without adequate follow up.  This format is felt to be most appropriate for this patient at this time.  All issues noted in this document were discussed and addressed.  A limited physical exam was performed with this format.  Please refer to the patient's chart for his consent to telehealth for [facility].   Date:  08/11/2018   ID:  [name], [name] Oct 12, 1945, MRN [id number]  Patient Location: Home Provider Location: Office  PCP:  Chesley Noon, MD  Cardiologist:  Peter Martinique, MD  Electrophysiologist:  None   Evaluation Performed:  Follow-Up Visit  Chief Complaint:  Hypertension and weight gain   History of Present Illness:    Jeffery Harvey is a 73 y.o. male with known history of hypertension, GERD, status post cholecystectomy in August 2015, August 2017 the patient was seen in the hospital for acute abdominal pain and had a small bowel diverticulitis with perforation and underwent small bowel resection.  When seen last by Dr. Martinique in July 2019 the patient's blood pressure was well controlled, no changes were made to his medication regimen nor any testing planned.He states that he has been going weight after snacking a lot and not exercising. He also states that he has had some LEE.  He has made some changes in his diet and eliminated salt and is starting to take walks. The edema has been much better and he has lost 5 lbs. BP has been doing well. He has not had any labs in a while. He states that he is having some trouble with aging. He doesn't have the energy he used to and gains weight easily. He is doing his best trying to be  more aware of his foods and activity.   The patient does have symptoms concerning for COVID-19 infection (fever, chills, cough, or new shortness of breath).    Past Medical History:  Diagnosis Date  . Arthritis    narrowing and disc degeneration of lower back"chroin pain"  . Cancer ([place])    skin cancer  . Chronic back pain    chronic back pain- see pain management MD  . Diverticulitis    had tiny perforation of bowel- 04/2014  . GI bleed 2008  . Gout   . Hypertension   . PONV (postoperative nausea and vomiting)    better with recent surgeries  . Swallowing difficulty    can swallow, but often times has to spit up" bring up a lot of foamy tough"  . Transfusion history    x2 episodes -post procedure polypectomies at different times under 2 yrs ago   Past Surgical History:  Procedure Laterality Date  . APPENDECTOMY     open,"rupture"  . BOWEL RESECTION N/A 09/29/2015   Procedure: SMALL BOWEL RESECTION;  Surgeon: Greer Pickerel, MD;  Location: [place];  Service: General;  Laterality: N/A;  . CERVICAL FUSION  2000  . CHOLECYSTECTOMY    . CHOLECYSTECTOMY, LAPAROSCOPIC  8/15  . COLONOSCOPY W/ BIOPSIES AND POLYPECTOMY    . ERCP N/A 06/02/2014   Procedure: ENDOSCOPIC RETROGRADE CHOLANGIOPANCREATOGRAPHY (ERCP);  Surgeon: Arta Silence, MD;  Location: [facility];  Service: Endoscopy;  Laterality: N/A;  .  ERCP N/A 06/29/2014   Procedure: ENDOSCOPIC RETROGRADE CHOLANGIOPANCREATOGRAPHY (ERCP);  Surgeon: Arta Silence, MD;  Location: [facility];  Service: Endoscopy;  Laterality: N/A;  . ESOPHAGOGASTRODUODENOSCOPY (EGD) WITH PROPOFOL N/A 08/04/2014   Procedure: ESOPHAGOGASTRODUODENOSCOPY (EGD) WITH PROPOFOL;  Surgeon: Arta Silence, MD;  [facility];  Service: Endoscopy;  Laterality: N/A;  . EUS N/A 05/26/2014   Procedure: FULL UPPER ENDOSCOPIC ULTRASOUND (EUS) RADIAL;  Surgeon: Arta Silence, MD;  [facility];  Service: Endoscopy;  Laterality: N/A;  . EUS N/A 06/29/2014    Procedure: UPPER ENDOSCOPIC ULTRASOUND (EUS) RADIAL;  Surgeon: Arta Silence, MD;  [facility];  Service: Endoscopy;  Laterality: N/A;  . HERNIA REPAIR     Bilateral  . INCISIONAL HERNIA REPAIR N/A 09/04/2016   Procedure: LAPAROSCOPIC REPAIR INCISIONAL HERNIA WITH MESH;  Surgeon: Greer Pickerel, MD;  Location: [facility];  Service: General;  Laterality: N/A;  . INSERTION OF MESH N/A 09/04/2016   Procedure: INSERTION OF MESH;  Surgeon: Greer Pickerel, MD;  Location: [facility];  Service: General;  Laterality: N/A;  . LAPAROSCOPY N/A 09/29/2015   Procedure: LAPAROSCOPY DIAGNOSTIC;  Surgeon: Greer Pickerel, MD;  Location: [place];  Service: General;  Laterality: N/A;  . LAPAROTOMY N/A 09/29/2015   Procedure: EXPLORATORY LAPAROTOMY;  Surgeon: Greer Pickerel, MD;  Location: [place];  Service: General;  Laterality: N/A;     Current Meds  Medication Sig  . carvedilol (COREG) 12.5 MG tablet TAKE 1 TABLET BY MOUTH TWICE DAILY  . docusate sodium (COLACE) 100 MG capsule Take 100 mg by mouth daily.  . DULoxetine (CYMBALTA) 60 MG capsule Take 60 mg by mouth daily.  . Febuxostat (ULORIC) 80 MG TABS Take 40 mg by mouth every evening.   Marland Kitchen losartan (COZAAR) 50 MG tablet Take 1 tablet (50 mg total) by mouth daily.  . naproxen sodium (ANAPROX) 220 MG tablet Take 440 mg by mouth daily as needed (for pain.).  Marland Kitchen oxyCODONE (OXYCONTIN) 20 mg 12 hr tablet Take 20 mg by mouth every 12 (twelve) hours.  . ranitidine (ZANTAC) 150 MG tablet Take 150 mg by mouth daily as needed for heartburn.   . tamsulosin (FLOMAX) 0.4 MG CAPS capsule Take 0.4 mg by mouth daily after supper.  . zolpidem (AMBIEN) 10 MG tablet Take 10 mg by mouth at bedtime.     Allergies:   Flagyl [metronidazole], Amlodipine, Penicillins, Allopurinol, Amlodipine besy-benazepril hcl, Ancef [cefazolin], Dexilant [dexlansoprazole], and Vioxx [rofecoxib]   Social History   Tobacco Use  . Smoking status: Never Smoker  . Smokeless tobacco: Never Used  Substance  Use Topics  . Alcohol use: No    Alcohol/week: 5.0 standard drinks    Types: 5 Standard drinks or equivalent per week    Comment: none in 2 years  . Drug use: No     Family Hx: The patient's family history includes Colon cancer in his mother; Heart failure (age of onset: 73) in his mother; Hypertension (age of onset: 81) in his sister; Other (age of onset: 33) in his father.  ROS:   Please see the history of present illness.    All other systems reviewed and are negative.   Prior CV studies:   The following studies were reviewed today: See scanned reports of echo and NM study. Both of which were normal.  Labs/Other Tests and Data Reviewed:    EKG:  No done this visit.   Recent Labs: No results found for requested labs within last 8760 hours.   Recent Lipid  Panel Lab Results  Component Value Date/Time   CHOL 146 02/22/2017 08:55 AM   TRIG 239 (H) 02/22/2017 08:55 AM   HDL 31 (L) 02/22/2017 08:55 AM   CHOLHDL 4.7 02/22/2017 08:55 AM   LDLCALC 67 02/22/2017 08:55 AM    Wt Readings from Last 3 Encounters:  08/11/18 220 lb (99.8 kg)  08/28/17 209 lb 9.6 oz (95.1 kg)  02/25/17 222 lb 9.6 oz (101 kg)     Objective:    Vital Signs:  BP 128/78   Pulse 62   Ht 6' 2.5" (1.892 m)   Wt 220 lb (99.8 kg)   BMI 27.87 kg/m    VITAL SIGNS:  reviewed GEN:  no acute distress EYES:  sclerae anicteric, EOMI - Extraocular Movements Intact RESPIRATORY:  normal respiratory effort, symmetric expansion MUSCULOSKELETAL:  no obvious deformities. NEURO:  alert and oriented x 3, no obvious focal deficit PSYCH:  normal affect  ASSESSMENT & PLAN:    1. Hypertension: BP is well controlled currently on his medication regimen. I will not make any changes at this time. He will have BMET completed for evaluation of renal status.   2. Unknown lipid status : With weight gain and dietary choices will check lipid status for need for statin.   3. Weight gain: He is now walking more and  eliminating salt and high caloric foods from his diet. I have encouraged him on this lifestyle change.   COVID-19 Education: The signs and symptoms of COVID-19 were discussed with the patient and how to seek care for testing (follow up with PCP or arrange E-visit).  The importance of social distancing was discussed today.  Time:   Today, I have spent 15 minutes with the patient with telehealth technology discussing the above problems.     Medication Adjustments/Labs and Tests Ordered: Current medicines are reviewed at length with the patient today.  Concerns regarding medicines are outlined above.   Tests Ordered: No orders of the defined types were placed in this encounter.   Medication Changes: No orders of the defined types were placed in this encounter.   Disposition:  Follow up 6 months   Signed, Phill Myron. West Pugh, ANP, AACC  08/11/2018 8:45 AM    [facility]

## 2018-08-11 ENCOUNTER — Other Ambulatory Visit: Payer: Self-pay | Admitting: Cardiology

## 2018-08-11 ENCOUNTER — Telehealth (INDEPENDENT_AMBULATORY_CARE_PROVIDER_SITE_OTHER): Payer: PPO | Admitting: Adult Health

## 2018-08-11 ENCOUNTER — Encounter: Payer: Self-pay | Admitting: Adult Health

## 2018-08-11 VITALS — BP 128/78 | HR 62 | Ht 74.5 in | Wt 220.0 lb

## 2018-08-11 DIAGNOSIS — R635 Abnormal weight gain: Secondary | ICD-10-CM | POA: Diagnosis not present

## 2018-08-11 DIAGNOSIS — E782 Mixed hyperlipidemia: Secondary | ICD-10-CM | POA: Diagnosis not present

## 2018-08-11 DIAGNOSIS — I10 Essential (primary) hypertension: Secondary | ICD-10-CM | POA: Diagnosis not present

## 2018-08-11 DIAGNOSIS — Z79899 Other long term (current) drug therapy: Secondary | ICD-10-CM

## 2018-08-11 NOTE — Addendum Note (Signed)
Addended by: Waylan Rocher on: 08/11/2018 09:34 AM   Modules accepted: Orders

## 2018-08-11 NOTE — Patient Instructions (Signed)
Medication Instructions:  NO CHANGES- Your physician recommends that you continue on your current medications as directed. Please refer to the Current Medication list given to you today. If you need a refill on your cardiac medications before your next appointment, please call your pharmacy.  Labwork: FASTING LIPID PANEL, BMET AND CBC-THIS WEEK HERE IN OUR OFFICE AT [facility]   You will need to fast. DO NOT EAT OR DRINK PAST MIDNIGHT.    Take the provided lab slips with you to the lab for your blood draw.   When you have your labs (blood work) drawn today and your tests are completely normal, you will receive your results only by MyChart Message (if you have MyChart) -OR-  A paper copy in the mail.  If you have any lab test that is abnormal or we need to change your treatment, we will call you to review these results.  Follow-Up: You will need a follow up appointment in 6 months.  Please call our office 2 months in advance, October 2020 to schedule this, December 2020 appointment.  You may see Peter Martinique, MD, Jory Sims, DNP, AACC or one of the following Advanced Practice Providers on your designated Care Team:  Almyra Deforest, [place]  Fabian Sharp, PA-C       At [facility], you and your health needs are our priority.  As part of our continuing mission to provide you with exceptional heart care, we have created designated Provider Care Teams.  These Care Teams include your primary Cardiologist (physician) and Advanced Practice Providers (APPs -  Physician Assistants and Nurse Practitioners) who all work together to provide you with the care you need, when you need it.  Thank you for choosing [facility] at [facility]!!

## 2018-09-11 DIAGNOSIS — M48061 Spinal stenosis, lumbar region without neurogenic claudication: Secondary | ICD-10-CM | POA: Diagnosis not present

## 2018-09-11 DIAGNOSIS — M533 Sacrococcygeal disorders, not elsewhere classified: Secondary | ICD-10-CM | POA: Diagnosis not present

## 2018-10-06 ENCOUNTER — Other Ambulatory Visit: Payer: Self-pay

## 2018-10-06 MED ORDER — LOSARTAN POTASSIUM 50 MG PO TABS: 50.0000 mg | ORAL_TABLET | Freq: Every day | ORAL | 1 refills | Status: DC

## 2018-11-05 DIAGNOSIS — G894 Chronic pain syndrome: Secondary | ICD-10-CM | POA: Diagnosis not present

## 2018-11-05 DIAGNOSIS — M47816 Spondylosis without myelopathy or radiculopathy, lumbar region: Secondary | ICD-10-CM | POA: Diagnosis not present

## 2018-11-05 DIAGNOSIS — M5136 Other intervertebral disc degeneration, lumbar region: Secondary | ICD-10-CM | POA: Diagnosis not present

## 2018-11-07 DIAGNOSIS — I10 Essential (primary) hypertension: Secondary | ICD-10-CM | POA: Diagnosis not present

## 2018-11-07 DIAGNOSIS — W57XXXA Bitten or stung by nonvenomous insect and other nonvenomous arthropods, initial encounter: Secondary | ICD-10-CM | POA: Diagnosis not present

## 2018-11-07 DIAGNOSIS — S30860A Insect bite (nonvenomous) of lower back and pelvis, initial encounter: Secondary | ICD-10-CM | POA: Diagnosis not present

## 2018-11-14 ENCOUNTER — Other Ambulatory Visit: Payer: Self-pay

## 2018-11-14 ENCOUNTER — Emergency Department
Admission: EM | Admit: 2018-11-14 | Discharge: 2018-11-14 | Disposition: A | Discharge status: Home / Self Care | Payer: PPO | Source: Home / Self Care | Attending: Emergency Medicine | Admitting: Emergency Medicine

## 2018-11-14 DIAGNOSIS — L02219 Cutaneous abscess of trunk, unspecified: Secondary | ICD-10-CM | POA: Diagnosis not present

## 2018-11-14 DIAGNOSIS — L03319 Cellulitis of trunk, unspecified: Secondary | ICD-10-CM | POA: Diagnosis not present

## 2018-11-14 NOTE — Discharge Instructions (Signed)
Continue your antibiotic. Remove the packing on Sunday. Clean the infected area starting Sunday with soap and water 3 times a day. Have the area checked in 10 days to make sure you have proper healing.

## 2018-11-14 NOTE — ED Triage Notes (Signed)
Felt bump on back about 2 weeks ago.  [facility], and they said it was an insect bite.  Was placed on antibiotic, is not any better, maybe worse.  Mid back

## 2018-11-14 NOTE — ED Provider Notes (Addendum)
[facility]    CSN: [id number] Arrival date & time: 11/14/18  1247      History   Chief Complaint Chief Complaint  Patient presents with  . Insect Bite  . Abscess    HPI Jeffery Harvey is a 73 y.o. male.   HPI Patient states that approximately 10 days ago he noticed a knot-like area form on his lower back.  He could still not see the area but it itched.  [facility] and [facility] diagnosed him with a possible abscess from a bite.  He was treated with sulfa twice a day.  He enters today because he is not improved and continues to have discomfort in the area.  He does have drainage from the area on his back. Past Medical History:  Diagnosis Date  . Arthritis    narrowing and disc degeneration of lower back"chroin pain"  . Cancer ([place])    skin cancer  . Chronic back pain    chronic back pain- see pain management MD  . Diverticulitis    had tiny perforation of bowel- 04/2014  . GI bleed 2008  . Gout   . Hypertension   . PONV (postoperative nausea and vomiting)    better with recent surgeries  . Swallowing difficulty    can swallow, but often times has to spit up" bring up a lot of foamy tough"  . Transfusion history    x2 episodes -post procedure polypectomies at different times under 2 yrs ago    Patient Active Problem List   Diagnosis Date Noted  . Incisional hernia 09/04/2016  . Diverticulitis of small intestine with perforation without bleeding 09/30/2015  . Diverticulitis of small intestine with perforation and abscess 09/28/2015  . Dizziness 03/15/2015  . Gout 11/08/2011  . HTN (hypertension) 10/08/2011    Past Surgical History:  Procedure Laterality Date  . APPENDECTOMY     open,"rupture"  . BOWEL RESECTION N/A 09/29/2015   Procedure: SMALL BOWEL RESECTION;  Surgeon: Greer Pickerel, MD;  Location: [place];  Service: General;  Laterality: N/A;  . CERVICAL FUSION  2000  . CHOLECYSTECTOMY    . CHOLECYSTECTOMY, LAPAROSCOPIC   8/15  . COLONOSCOPY W/ BIOPSIES AND POLYPECTOMY    . ERCP N/A 06/02/2014   Procedure: ENDOSCOPIC RETROGRADE CHOLANGIOPANCREATOGRAPHY (ERCP);  Surgeon: Arta Silence, MD;  Location: [facility];  Service: Endoscopy;  Laterality: N/A;  . ERCP N/A 06/29/2014   Procedure: ENDOSCOPIC RETROGRADE CHOLANGIOPANCREATOGRAPHY (ERCP);  Surgeon: Arta Silence, MD;  Location: [facility];  Service: Endoscopy;  Laterality: N/A;  . ESOPHAGOGASTRODUODENOSCOPY (EGD) WITH PROPOFOL N/A 08/04/2014   Procedure: ESOPHAGOGASTRODUODENOSCOPY (EGD) WITH PROPOFOL;  Surgeon: Arta Silence, MD;  [facility]: [facility];  Service: Endoscopy;  Laterality: N/A;  . EUS N/A 05/26/2014   Procedure: FULL UPPER ENDOSCOPIC ULTRASOUND (EUS) RADIAL;  Surgeon: Arta Silence, MD;  [facility];  Service: Endoscopy;  Laterality: N/A;  . EUS N/A 06/29/2014   Procedure: UPPER ENDOSCOPIC ULTRASOUND (EUS) RADIAL;  Surgeon: Arta Silence, MD;  [facility];  Service: Endoscopy;  Laterality: N/A;  . HERNIA REPAIR     Bilateral  . INCISIONAL HERNIA REPAIR N/A 09/04/2016   Procedure: LAPAROSCOPIC REPAIR INCISIONAL HERNIA WITH MESH;  Surgeon: Greer Pickerel, MD;  [facility];  Service: General;  Laterality: N/A;  . INSERTION OF MESH N/A 09/04/2016   Procedure: INSERTION OF MESH;  Surgeon: Greer Pickerel, MD;  [facility]: [facility];  Service: General;  Laterality: N/A;  . LAPAROSCOPY  N/A 09/29/2015   Procedure: LAPAROSCOPY DIAGNOSTIC;  Surgeon: Greer Pickerel, MD;  Location: [place];  Service: General;  Laterality: N/A;  . LAPAROTOMY N/A 09/29/2015   Procedure: EXPLORATORY LAPAROTOMY;  Surgeon: Greer Pickerel, MD;  Location: [place];  Service: General;  Laterality: N/A;       Home Medications    Prior to Admission medications   Medication Sig Start Date End Date Taking? Authorizing Provider  carvedilol (COREG) 12.5 MG tablet TAKE 1 TABLET BY MOUTH TWICE A DAY 08/11/18   Martinique, Peter M, MD  docusate sodium (COLACE) 100 MG capsule Take  100 mg by mouth daily.    [provider]  DULoxetine (CYMBALTA) 60 MG capsule Take 60 mg by mouth daily.    [provider]  Febuxostat (ULORIC) 80 MG TABS Take 40 mg by mouth every evening.     [provider]  losartan (COZAAR) 50 MG tablet Take 1 tablet (50 mg total) by mouth daily. 10/06/18   Martinique, Peter M, MD  naproxen sodium (ANAPROX) 220 MG tablet Take 440 mg by mouth daily as needed (for pain.).    [provider]  oxyCODONE (OXYCONTIN) 20 mg 12 hr tablet Take 20 mg by mouth every 12 (twelve) hours.    [provider]  ranitidine (ZANTAC) 150 MG tablet Take 150 mg by mouth daily as needed for heartburn.     [provider]  tamsulosin (FLOMAX) 0.4 MG CAPS capsule Take 0.4 mg by mouth daily after supper.    [provider]  zolpidem (AMBIEN) 10 MG tablet Take 10 mg by mouth at bedtime. 08/13/16   [provider]    Family History Family History  Problem Relation Age of Onset  . Other Father 41       Blood Disorder  . Heart failure Mother 51  . Colon cancer Mother   . Hypertension Sister 67       Valve repaired    Social History Social History   Tobacco Use  . Smoking status: Never Smoker  . Smokeless tobacco: Never Used  Substance Use Topics  . Alcohol use: No    Alcohol/week: 5.0 standard drinks    Types: 5 Standard drinks or equivalent per week    Comment: none in 2 years  . Drug use: No     Allergies   Flagyl [metronidazole], Amlodipine, Penicillins, Allopurinol, Amlodipine besy-benazepril hcl, Ancef [cefazolin], Dexilant [dexlansoprazole], and Vioxx [rofecoxib]   Review of Systems Review of Systems  Constitutional: Negative.   Skin:       He has redness and irritation and pain on a knot  of his lower back.     Physical Exam Triage Vital Signs ED Triage Vitals  Enc Vitals Group     BP 11/14/18 1313 119/74     Pulse Rate 11/14/18 1313 66     Resp 11/14/18 1313 20     Temp 11/14/18  1313 98 F (36.7 C)     Temp Source 11/14/18 1313 Oral     SpO2 11/14/18 1313 98 %     Weight 11/14/18 1315 221 lb (100.2 kg)     Height 11/14/18 1315 6\' 3"  (1.905 m)     Head Circumference --      Peak Flow --      Pain Score 11/14/18 1314 2     Pain Loc --      Pain Edu? --      Excl. in GC? --    No  data found.  Updated Vital Signs BP 119/74 (BP Location: Right Arm)   Pulse 66   Temp 98 F (36.7 C) (Oral)   Resp 20   Ht 6\' 3"  (1.905 m)   Wt 100.2 kg   SpO2 98%   BMI 27.62 kg/m   Visual Acuity Right Eye Distance:   Left Eye Distance:   Bilateral Distance:    Right Eye Near:   Left Eye Near:    Bilateral Near:     Physical Exam Constitutional:      Appearance: Normal appearance.  Cardiovascular:     Rate and Rhythm: Normal rate.  Skin:    Comments: There is a 3 x 2 cm red tender area with what appears to be small pustules in the central part of the lesion.  Neurological:     Mental Status: He is alert.   Procedure note: Verbal consent was obtained     The area was prepped with Betadine.  The area was numbed with 2 cc of 1% plain.  A #11 blade was used and 1 cm incision made.  Purulent material mixed with sebaceous debris was obtained.  A blunt hemostat was used to help express the purulent material.  This was sent for culture and a centimeter and a half packing was placed.  Patient tolerated the procedure well  UC Treatments / Results  Labs (all labs ordered are listed, but only abnormal results are displayed) Labs Reviewed  WOUND CULTURE    EKG   Radiology No results found.  Procedures Procedures (including critical care time)  Medications Ordered in UC Medications - No data to display  Initial Impression / Assessment and Plan / UC Course  I have reviewed the triage vital signs and the nursing notes. Patient has what appears to be an infected sebaceous cyst of his mid back.  Culture was sent.  He was instructed to stay on the Septra he is on  until the culture returns.  He has multiple antibiotic allergies.  Will await culture results prior to changing his antibiotics.  He will keep the area clean with soap and water.  The packing will be removed on Sunday by his ex-wife who is a Marine scientist.  He agrees to have the area rechecked in 10 days. Pertinent labs & imaging results that were available during my care of the patient were reviewed by me and considered in my medical decision making (see chart for details).       Final Clinical Impressions(s) / UC Diagnoses   Final diagnoses:  Cellulitis and abscess of trunk     Discharge Instructions     Continue your antibiotic. Remove the packing on Sunday. Clean the infected area starting Sunday with soap and water 3 times a day. Have the area checked in 10 days to make sure you have proper healing.    ED Prescriptions    None     PDMP not reviewed this encounter.   Darlyne Russian, MD 11/14/18 1421    Darlyne Russian, MD 11/14/18 [phone]

## 2018-11-17 LAB — WOUND CULTURE
MICRO NUMBER:: 924244
SPECIMEN QUALITY:: ADEQUATE

## 2018-11-18 ENCOUNTER — Telehealth: Payer: Self-pay

## 2018-11-18 NOTE — Telephone Encounter (Signed)
Spoke with patient, he took the packing out.  He says it seems to be healing well.  Will follow up in a couple of days if needed.

## 2018-11-19 DIAGNOSIS — G4700 Insomnia, unspecified: Secondary | ICD-10-CM | POA: Insufficient documentation

## 2018-11-19 DIAGNOSIS — R351 Nocturia: Secondary | ICD-10-CM | POA: Diagnosis not present

## 2018-11-19 DIAGNOSIS — I10 Essential (primary) hypertension: Secondary | ICD-10-CM | POA: Diagnosis not present

## 2018-12-02 DIAGNOSIS — M47816 Spondylosis without myelopathy or radiculopathy, lumbar region: Secondary | ICD-10-CM | POA: Diagnosis not present

## 2018-12-16 DIAGNOSIS — M47816 Spondylosis without myelopathy or radiculopathy, lumbar region: Secondary | ICD-10-CM | POA: Diagnosis not present

## 2018-12-25 DIAGNOSIS — M47816 Spondylosis without myelopathy or radiculopathy, lumbar region: Secondary | ICD-10-CM | POA: Diagnosis not present

## 2019-01-28 DIAGNOSIS — E663 Overweight: Secondary | ICD-10-CM | POA: Diagnosis not present

## 2019-01-28 DIAGNOSIS — Z6829 Body mass index (BMI) 29.0-29.9, adult: Secondary | ICD-10-CM | POA: Diagnosis not present

## 2019-01-28 DIAGNOSIS — M150 Primary generalized (osteo)arthritis: Secondary | ICD-10-CM | POA: Diagnosis not present

## 2019-01-28 DIAGNOSIS — M1A09X0 Idiopathic chronic gout, multiple sites, without tophus (tophi): Secondary | ICD-10-CM | POA: Diagnosis not present

## 2019-01-28 DIAGNOSIS — M5136 Other intervertebral disc degeneration, lumbar region: Secondary | ICD-10-CM | POA: Diagnosis not present

## 2019-01-29 DIAGNOSIS — M47816 Spondylosis without myelopathy or radiculopathy, lumbar region: Secondary | ICD-10-CM | POA: Diagnosis not present

## 2019-02-19 ENCOUNTER — Ambulatory Visit: Payer: PPO | Admitting: Physician Assistant

## 2019-02-19 NOTE — Progress Notes (Deleted)
Cardiology Office Note:    Date:  02/19/2019   ID:  Jeno, Debock [birth year], MRN [id number]  PCP:  Chesley Noon, MD  Cardiologist:  Peter Martinique, MD  Electrophysiologist:  None   Referring MD: Chesley Noon, MD   No chief complaint on file. ***  History of Present Illness:    Jeffery Harvey is a 73 y.o. male with a hx of hypertension, GERD and chronic back pain.  Past Medical History:  Diagnosis Date  . Arthritis    narrowing and disc degeneration of lower back"chroin pain"  . Cancer ([place])    skin cancer  . Chronic back pain    chronic back pain- see pain management MD  . Diverticulitis    had tiny perforation of bowel- 04/2014  . GI bleed 2008  . Gout   . Hypertension   . PONV (postoperative nausea and vomiting)    better with recent surgeries  . Swallowing difficulty    can swallow, but often times has to spit up" bring up a lot of foamy tough"  . Transfusion history    x2 episodes -post procedure polypectomies at different times under 2 yrs ago    Past Surgical History:  Procedure Laterality Date  . APPENDECTOMY     open,"rupture"  . BOWEL RESECTION N/A 09/29/2015   Procedure: SMALL BOWEL RESECTION;  Surgeon: Greer Pickerel, MD;  Location: [facility];  Service: General;  Laterality: N/A;  . CERVICAL FUSION  2000  . CHOLECYSTECTOMY    . CHOLECYSTECTOMY, LAPAROSCOPIC  8/15  . COLONOSCOPY W/ BIOPSIES AND POLYPECTOMY    . ERCP N/A 06/02/2014   Procedure: ENDOSCOPIC RETROGRADE CHOLANGIOPANCREATOGRAPHY (ERCP);  Surgeon: Arta Silence, MD;  Location: [facility];  Service: Endoscopy;  Laterality: N/A;  . ERCP N/A 06/29/2014   Procedure: ENDOSCOPIC RETROGRADE CHOLANGIOPANCREATOGRAPHY (ERCP);  Surgeon: Arta Silence, MD;  Location: [facility];  Service: Endoscopy;  Laterality: N/A;  . ESOPHAGOGASTRODUODENOSCOPY (EGD) WITH PROPOFOL N/A 08/04/2014   Procedure: ESOPHAGOGASTRODUODENOSCOPY (EGD) WITH PROPOFOL;  Surgeon: Arta Silence, MD;  [facility];  Service: Endoscopy;  Laterality: N/A;  . EUS N/A 05/26/2014   Procedure: FULL UPPER ENDOSCOPIC ULTRASOUND (EUS) RADIAL;  Surgeon: Arta Silence, MD;  [facility];  Service: Endoscopy;  Laterality: N/A;  . EUS N/A 06/29/2014   Procedure: UPPER ENDOSCOPIC ULTRASOUND (EUS) RADIAL;  Surgeon: Arta Silence, MD;  [facility];  Service: Endoscopy;  Laterality: N/A;  . HERNIA REPAIR     Bilateral  . INCISIONAL HERNIA REPAIR N/A 09/04/2016   Procedure: LAPAROSCOPIC REPAIR INCISIONAL HERNIA WITH MESH;  Surgeon: Greer Pickerel, MD;  Location[facility];  Service: General;  Laterality: N/A;  . INSERTION OF MESH N/A 09/04/2016   Procedure: INSERTION OF MESH;  Surgeon: Greer Pickerel, MD;  Location: [facility];  Service: General;  Laterality: N/A;  . LAPAROSCOPY N/A 09/29/2015   Procedure: LAPAROSCOPY DIAGNOSTIC;  Surgeon: Greer Pickerel, MD;  Location: [facility];  Service: General;  Laterality: N/A;  . LAPAROTOMY N/A 09/29/2015   Procedure: EXPLORATORY LAPAROTOMY;  Surgeon: Greer Pickerel, MD;  Location: [facility];  Service: General;  Laterality: N/A;    Current Medications: No outpatient medications have been marked as taking for the 02/19/19 encounter (Appointment) with Almyra Deforest, [place].     Allergies:   Flagyl [metronidazole], Amlodipine, Penicillins, Allopurinol, Amlodipine besy-benazepril hcl, Ancef [cefazolin], Dexilant [dexlansoprazole], and Vioxx [rofecoxib]   Social History   Socioeconomic History  . Marital status: Married    Spouse name: Not on  file  . Number of children: Not on file  . Years of education: Not on file  . Highest education level: Not on file  Occupational History  . Not on file  Tobacco Use  . Smoking status: Never Smoker  . Smokeless tobacco: Never Used  Substance and Sexual Activity  . Alcohol use: No    Alcohol/week: 5.0 standard drinks    Types: 5 Standard drinks or equivalent per week    Comment: none in 2 years  . Drug use: No  . Sexual activity: Not  Currently  Other Topics Concern  . Not on file  Social History Narrative  . Not on file   Social Determinants of Health   Financial Resource Strain:   . Difficulty of Paying Living Expenses: Not on file  Food Insecurity:   . Worried About Charity fundraiser in the Last Year: Not on file  . Ran Out of Food in the Last Year: Not on file  Transportation Needs:   . Lack of Transportation (Medical): Not on file  . Lack of Transportation (Non-Medical): Not on file  Physical Activity:   . Days of Exercise per Week: Not on file  . Minutes of Exercise per Session: Not on file  Stress:   . Feeling of Stress : Not on file  Social Connections:   . Frequency of Communication with Friends and Family: Not on file  . Frequency of Social Gatherings with Friends and Family: Not on file  . Attends Religious Services: Not on file  . Active Member of Clubs or Organizations: Not on file  . Attends Archivist Meetings: Not on file  . Marital Status: Not on file     Family History: The patient's ***family history includes Colon cancer in his mother; Heart failure (age of onset: 60) in his mother; Hypertension (age of onset: 21) in his sister; Other (age of onset: 55) in his father.  ROS:   Please see the history of present illness.    *** All other systems reviewed and are negative.  EKGs/Labs/Other Studies Reviewed:    The following studies were reviewed today: ***  EKG:  EKG is *** ordered today.  The ekg ordered today demonstrates ***  Recent Labs: No results found for requested labs within last 8760 hours.  Recent Lipid Panel    Component Value Date/Time   CHOL 146 02/22/2017 0855   TRIG 239 (H) 02/22/2017 0855   HDL 31 (L) 02/22/2017 0855   CHOLHDL 4.7 02/22/2017 0855   LDLCALC 67 02/22/2017 0855    Physical Exam:    VS:  There were no vitals taken for this visit.    Wt Readings from Last 3 Encounters:  11/14/18 221 lb (100.2 kg)  08/11/18 220 lb (99.8 kg)   08/28/17 209 lb 9.6 oz (95.1 kg)     GEN: *** Well nourished, well developed in no acute distress HEENT: Normal NECK: No JVD; No carotid bruits LYMPHATICS: No lymphadenopathy CARDIAC: ***RRR, no murmurs, rubs, gallops RESPIRATORY:  Clear to auscultation without rales, wheezing or rhonchi  ABDOMEN: Soft, non-tender, non-distended MUSCULOSKELETAL:  No edema; No deformity  SKIN: Warm and dry NEUROLOGIC:  Alert and oriented x 3 PSYCHIATRIC:  Normal affect   ASSESSMENT:    No diagnosis found. PLAN:    In order of problems listed above:  1. ***   Medication Adjustments/Labs and Tests Ordered: Current medicines are reviewed at length with the patient today.  Concerns regarding medicines are outlined above.  No orders of the defined types were placed in this encounter.  No orders of the defined types were placed in this encounter.   There are no Patient Instructions on file for this visit.   Hilbert Corrigan, [place]  02/19/2019 2:25 PM    [facility]

## 2019-02-21 ENCOUNTER — Other Ambulatory Visit: Payer: Self-pay | Admitting: Cardiology

## 2019-02-26 ENCOUNTER — Encounter: Payer: Self-pay | Admitting: Physician Assistant

## 2019-02-26 ENCOUNTER — Ambulatory Visit (INDEPENDENT_AMBULATORY_CARE_PROVIDER_SITE_OTHER): Payer: PPO | Admitting: Physician Assistant

## 2019-02-26 ENCOUNTER — Other Ambulatory Visit: Payer: Self-pay

## 2019-02-26 VITALS — BP 90/57 | HR 62 | Temp 93.9°F | Ht 74.5 in | Wt 230.0 lb

## 2019-02-26 DIAGNOSIS — E781 Pure hyperglyceridemia: Secondary | ICD-10-CM | POA: Diagnosis not present

## 2019-02-26 DIAGNOSIS — I10 Essential (primary) hypertension: Secondary | ICD-10-CM | POA: Diagnosis not present

## 2019-02-26 MED ORDER — LOSARTAN POTASSIUM 25 MG PO TABS: 25.0000 mg | ORAL_TABLET | Freq: Every day | ORAL | 3 refills | Status: DC

## 2019-02-26 NOTE — Patient Instructions (Addendum)
Medication Instructions:  DECREASE Losartan to 25mg  Take 1 tablet once a day  *If you need a refill on your cardiac medications before your next appointment, please call your pharmacy*  Lab Work: Your physician recommends that you return for lab work in: 6 months [place], [place] If you have labs (blood work) drawn today and your tests are completely normal, you will receive your results only by: Marland Kitchen MyChart Message (if you have MyChart) OR . A paper copy in the mail If you have any lab test that is abnormal or we need to change your treatment, we will call you to review the results.  Testing/Procedures: None   Follow-Up: At [facility], you and your health needs are our priority.  As part of our continuing mission to provide you with exceptional heart care, we have created designated Provider Care Teams.  These Care Teams include your primary Cardiologist (physician) and Advanced Practice Providers (APPs -  Physician Assistants and Nurse Practitioners) who all work together to provide you with the care you need, when you need it.  Your next appointment:   6 month(s)  The format for your next appointment:   In Person  Provider:   Peter Martinique, MD  Other Instructions

## 2019-02-26 NOTE — Progress Notes (Signed)
Cardiology Office Note:    Date:  02/28/2019   ID:  Jeffery Harvey., DOB 12-11-1945, MRN [id number]  PCP:  Chesley Noon, MD  Cardiologist:  Peter Martinique, MD  Electrophysiologist:  None   Referring MD: Chesley Noon, MD   Chief Complaint  Patient presents with  . Follow-up    seen for Dr. Martinique    History of Present Illness:    Jeffery Harvey. is a 74 y.o. male with a hx of HTN, GERD, and chronic back pain.  Myoview obtained on 06/20/2009 showed normal perfusion.  Last echocardiogram in 06/2009 showed normal EF 55%, mild MR.  Patient was last seen by Bunnie Domino, NP on 08/11/2018 at which time his blood pressure was very well controlled.  Patient presents today for cardiology office visit.  He has a focal spot on the right side of the sternum that has occasional pain when he walks.  However he says he has been noticing this discomfort about once a month since he was in his 52s.  This is chronic and has not shown any increase in frequency or duration.  I will hold off on ischemic work-up.  His blood pressure is borderline low today.  Even on repeat manual recheck by myself, blood pressure remained 100/60.  I will decrease his losartan to Coreg 25 mg daily.  He is overdue for fasting lipid panel, previous fasting lipid panel in January 2019 demonstrated hypertriglyceridemia.  He will need fasting lipid panel and complete metabolic panel in 6 months prior to his next office visit.  Past Medical History:  Diagnosis Date  . Arthritis    narrowing and disc degeneration of lower back"chroin pain"  . Cancer ([place])    skin cancer  . Chronic back pain    chronic back pain- see pain management MD  . Diverticulitis    had tiny perforation of bowel- 04/2014  . GI bleed 2008  . Gout   . Hypertension   . PONV (postoperative nausea and vomiting)    better with recent surgeries  . Swallowing difficulty    can swallow, but often times has to spit up" bring up a lot of foamy  tough"  . Transfusion history    x2 episodes -post procedure polypectomies at different times under 2 yrs ago    Past Surgical History:  Procedure Laterality Date  . APPENDECTOMY     open,"rupture"  . BOWEL RESECTION N/A 09/29/2015   Procedure: SMALL BOWEL RESECTION;  Surgeon: Greer Pickerel, MD;  Location: [place];  Service: General;  Laterality: N/A;  . CERVICAL FUSION  2000  . CHOLECYSTECTOMY    . CHOLECYSTECTOMY, LAPAROSCOPIC  8/15  . COLONOSCOPY W/ BIOPSIES AND POLYPECTOMY    . ERCP N/A 06/02/2014   Procedure: ENDOSCOPIC RETROGRADE CHOLANGIOPANCREATOGRAPHY (ERCP);  Surgeon: Arta Silence, MD;  Location: [facility];  Service: Endoscopy;  Laterality: N/A;  . ERCP N/A 06/29/2014   Procedure: ENDOSCOPIC RETROGRADE CHOLANGIOPANCREATOGRAPHY (ERCP);  Surgeon: Arta Silence, MD;  Location: [facility];  Service: Endoscopy;  Laterality: N/A;  . ESOPHAGOGASTRODUODENOSCOPY (EGD) WITH PROPOFOL N/A 08/04/2014   Procedure: ESOPHAGOGASTRODUODENOSCOPY (EGD) WITH PROPOFOL;  Surgeon: Arta Silence, MD;  [facility];  Service: Endoscopy;  Laterality: N/A;  . EUS N/A 05/26/2014   Procedure: FULL UPPER ENDOSCOPIC ULTRASOUND (EUS) RADIAL;  Surgeon: Arta Silence, MD;  [facility];  Service: Endoscopy;  Laterality: N/A;  . EUS N/A 06/29/2014   Procedure: UPPER ENDOSCOPIC ULTRASOUND (EUS) RADIAL;  Surgeon: Arta Silence, MD;  [facility];  Service: Endoscopy;  Laterality: N/A;  . HERNIA REPAIR     Bilateral  . INCISIONAL HERNIA REPAIR N/A 09/04/2016   Procedure: LAPAROSCOPIC REPAIR INCISIONAL HERNIA WITH MESH;  Surgeon: Greer Pickerel, MD;  [facility];  Service: General;  Laterality: N/A;  . INSERTION OF MESH N/A 09/04/2016   Procedure: INSERTION OF MESH;  Surgeon: Greer Pickerel, MD;  Location: [facility];  Service: General;  Laterality: N/A;  . LAPAROSCOPY N/A 09/29/2015   Procedure: LAPAROSCOPY DIAGNOSTIC;  Surgeon: Greer Pickerel, MD;  Location: [place];  Service: General;   Laterality: N/A;  . LAPAROTOMY N/A 09/29/2015   Procedure: EXPLORATORY LAPAROTOMY;  Surgeon: Greer Pickerel, MD;  Location: [place];  Service: General;  Laterality: N/A;    Current Medications: Current Meds  Medication Sig  . carvedilol (COREG) 12.5 MG tablet TAKE 1 TABLET BY MOUTH TWICE A DAY  . docusate sodium (COLACE) 100 MG capsule Take 100 mg by mouth daily.  . DULoxetine (CYMBALTA) 60 MG capsule Take 60 mg by mouth daily.  . febuxostat (ULORIC) 40 MG tablet Take 40 mg by mouth daily.  Marland Kitchen losartan (COZAAR) 25 MG tablet Take 1 tablet (25 mg total) by mouth daily.  . naproxen sodium (ANAPROX) 220 MG tablet Take 440 mg by mouth daily as needed (for pain.).  Marland Kitchen oxyCODONE (OXYCONTIN) 20 mg 12 hr tablet Take 20 mg by mouth every 12 (twelve) hours.  . tamsulosin (FLOMAX) 0.4 MG CAPS capsule Take 0.4 mg by mouth daily after supper.  . zolpidem (AMBIEN) 10 MG tablet Take 10 mg by mouth at bedtime.  . [DISCONTINUED] losartan (COZAAR) 50 MG tablet Take 1 tablet (50 mg total) by mouth daily.     Allergies:   Flagyl [metronidazole], Amlodipine, Penicillins, Allopurinol, Amlodipine besy-benazepril hcl, Ancef [cefazolin], Dexilant [dexlansoprazole], and Vioxx [rofecoxib]   Social History   Socioeconomic History  . Marital status: Married    Spouse name: Not on file  . Number of children: Not on file  . Years of education: Not on file  . Highest education level: Not on file  Occupational History  . Not on file  Tobacco Use  . Smoking status: Never Smoker  . Smokeless tobacco: Never Used  Substance and Sexual Activity  . Alcohol use: No    Alcohol/week: 5.0 standard drinks    Types: 5 Standard drinks or equivalent per week    Comment: none in 2 years  . Drug use: No  . Sexual activity: Not Currently  Other Topics Concern  . Not on file  Social History Narrative  . Not on file   Social Determinants of Health   Financial Resource Strain:   . Difficulty of Paying Living Expenses: Not on  file  Food Insecurity:   . Worried About Charity fundraiser in the Last Year: Not on file  . Ran Out of Food in the Last Year: Not on file  Transportation Needs:   . Lack of Transportation (Medical): Not on file  . Lack of Transportation (Non-Medical): Not on file  Physical Activity:   . Days of Exercise per Week: Not on file  . Minutes of Exercise per Session: Not on file  Stress:   . Feeling of Stress : Not on file  Social Connections:   . Frequency of Communication with Friends and Family: Not on file  . Frequency of Social Gatherings with Friends and Family: Not on file  . Attends Religious Services: Not on file  . Active  Member of Clubs or Organizations: Not on file  . Attends Archivist Meetings: Not on file  . Marital Status: Not on file     Family History: The patient's family history includes Colon cancer in his mother; Heart failure (age of onset: 17) in his mother; Hypertension (age of onset: 61) in his sister; Other (age of onset: 67) in his father.  ROS:   Please see the history of present illness.     All other systems reviewed and are negative.  EKGs/Labs/Other Studies Reviewed:    The following studies were reviewed today:  N/A  EKG:  EKG is ordered today.  The ekg ordered today demonstrates normal sinus rhythm without significant ST-T wave changes  Recent Labs: No results found for requested labs within last 8760 hours.  Recent Lipid Panel    Component Value Date/Time   CHOL 146 02/22/2017 0855   TRIG 239 (H) 02/22/2017 0855   HDL 31 (L) 02/22/2017 0855   CHOLHDL 4.7 02/22/2017 0855   LDLCALC 67 02/22/2017 0855    Physical Exam:    VS:  BP (!) 90/57   Pulse 62   Temp (!) 93.9 F (34.4 C) (Temporal)   Ht 6' 2.5" (1.892 m)   Wt 230 lb (104.3 kg)   BMI 29.14 kg/m     Wt Readings from Last 3 Encounters:  02/26/19 230 lb (104.3 kg)  11/14/18 221 lb (100.2 kg)  08/11/18 220 lb (99.8 kg)     GEN:  Well nourished, well developed in  no acute distress HEENT: Normal NECK: No JVD; No carotid bruits LYMPHATICS: No lymphadenopathy CARDIAC: RRR, no murmurs, rubs, gallops RESPIRATORY:  Clear to auscultation without rales, wheezing or rhonchi  ABDOMEN: Soft, non-tender, non-distended MUSCULOSKELETAL:  No edema; No deformity  SKIN: Warm and dry NEUROLOGIC:  Alert and oriented x 3 PSYCHIATRIC:  Normal affect   ASSESSMENT:    1. Essential hypertension   2. Hypertriglyceridemia    PLAN:    In order of problems listed above:  1. Hypertension: Blood pressure is low, I will decrease losartan to 25 mg daily.  Continue carvedilol  2. Hypertriglyceridemia: Due for repeat lipid panel in 6 months.   Medication Adjustments/Labs and Tests Ordered: Current medicines are reviewed at length with the patient today.  Concerns regarding medicines are outlined above.  Orders Placed This Encounter  Procedures  . Comprehensive Metabolic Panel (CMET)  . Lipid panel  . EKG 12-Lead   Meds ordered this encounter  Medications  . losartan (COZAAR) 25 MG tablet    Sig: Take 1 tablet (25 mg total) by mouth daily.    Dispense:  90 tablet    Refill:  3    Patient Instructions  Medication Instructions:  DECREASE Losartan to 25mg  Take 1 tablet once a day  *If you need a refill on your cardiac medications before your next appointment, please call your pharmacy*  Lab Work: Your physician recommends that you return for lab work in: 6 months [place], CMET If you have labs (blood work) drawn today and your tests are completely normal, you will receive your results only by: Marland Kitchen MyChart Message (if you have MyChart) OR . A paper copy in the mail If you have any lab test that is abnormal or we need to change your treatment, we will call you to review the results.  Testing/Procedures: None   Follow-Up: At [facility], you and your health needs are our priority.  As part of our continuing  mission to provide you with exceptional  heart care, we have created designated Provider Care Teams.  These Care Teams include your primary Cardiologist (physician) and Advanced Practice Providers (APPs -  Physician Assistants and Nurse Practitioners) who all work together to provide you with the care you need, when you need it.  Your next appointment:   6 month(s)  The format for your next appointment:   In Person  Provider:   Peter Martinique, MD  Other Instructions      Signed, Almyra Deforest, [place]  02/28/2019 11:47 PM    [place]

## 2019-02-28 ENCOUNTER — Encounter: Payer: Self-pay | Admitting: Physician Assistant

## 2019-03-01 IMAGING — CT CT ABD-PELV W/ CM
3 of 5 series · 12 of 36 positions shown, 18 images · IV contrast (WATER & [ID] ISOVUE 300)
Comparison: None

CLINICAL DATA: Incisional hernia post small bowel surgery and
diverticulitis in September 2015, umbilical hernia, history
hypertension

EXAM:
CT ABDOMEN AND PELVIS WITH CONTRAST
TECHNIQUE: Multidetector CT imaging of the abdomen and pelvis was performed
using the standard protocol following bolus administration of
intravenous contrast. Sagittal and coronal MPR images reconstructed
from axial data set.
Creatinine was obtained on site at [HOSPITAL] at [REDACTED].
Results: Creatinine 0.7 mg/dL.
CONTRAST:  125mL [id number] IOPAMIDOL ([id number]-[id number]) INJECTION 61% IV.
No oral contrast administered.

[Series 3: abd/pelvis with · axial · 0.87mm/px · z∈[-381,-21]mm · 7 of 97 slices shown, 12 images]
[im 13/97  soft-tissue]
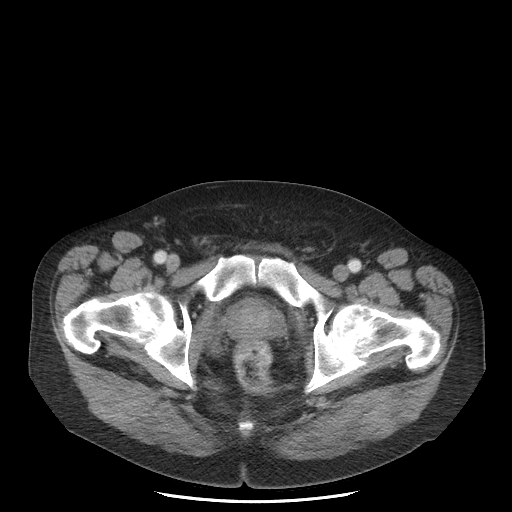
[im 13/97  bone]
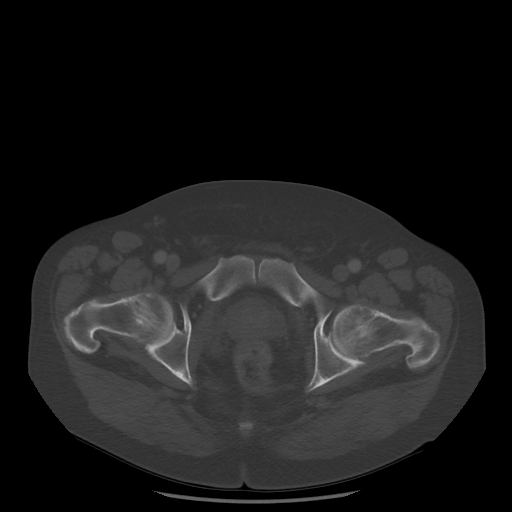
[im 25/97  soft-tissue]
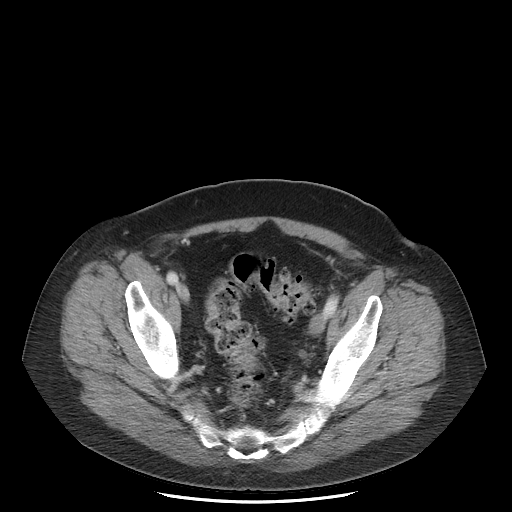
[im 37/97  soft-tissue]
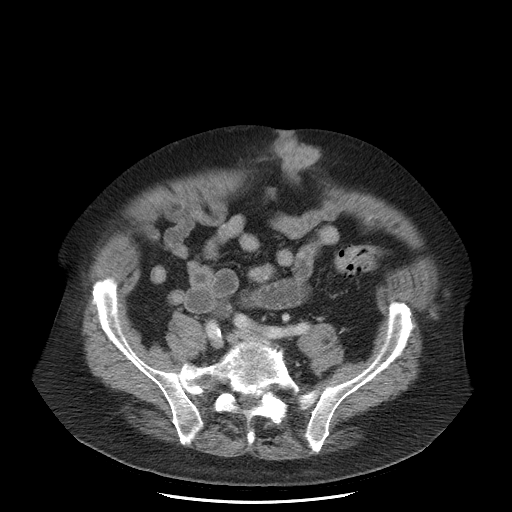
[im 49/97  soft-tissue]
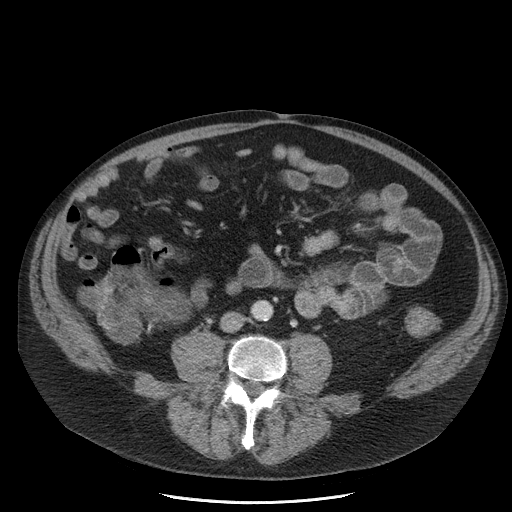
[im 49/97  lung]
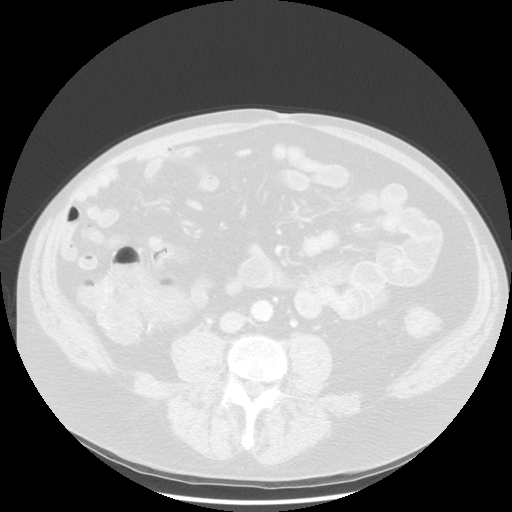
[im 61/97  soft-tissue]
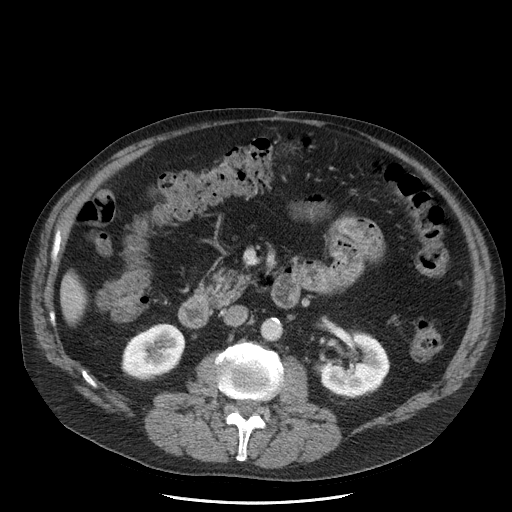
[im 61/97  lung]
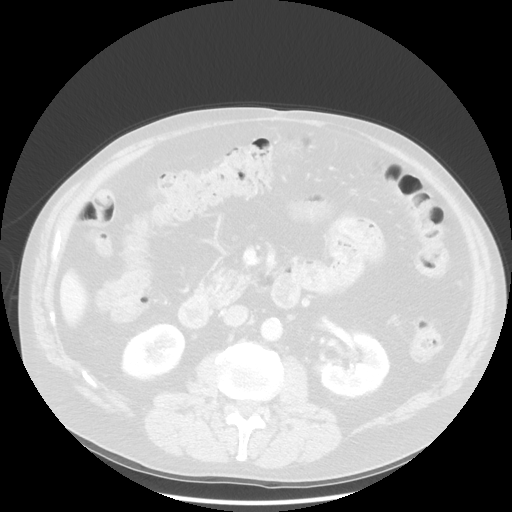
[im 73/97  soft-tissue]
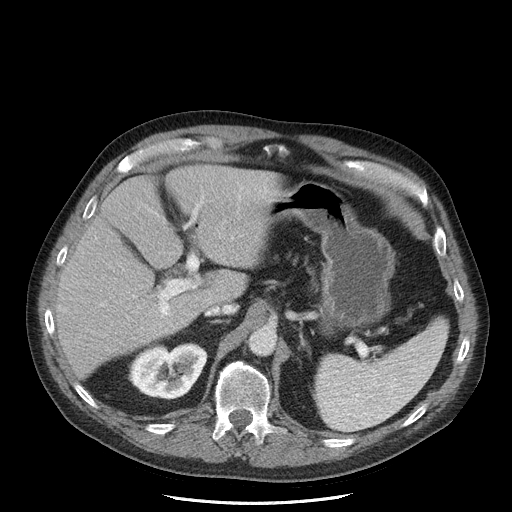
[im 73/97  lung]
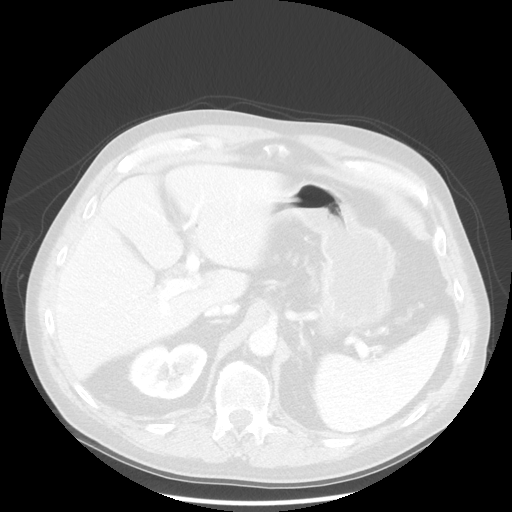
[im 85/97  soft-tissue]
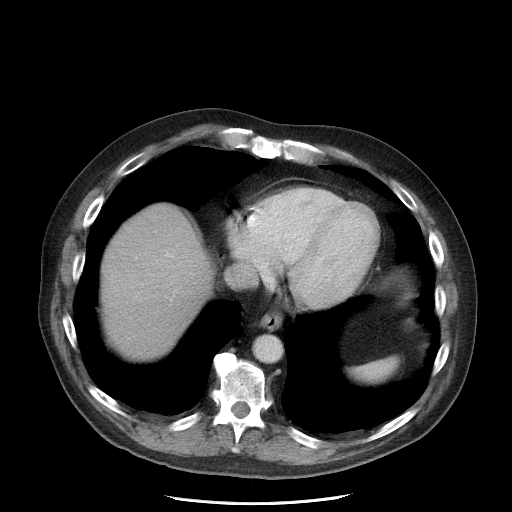
[im 85/97  lung]
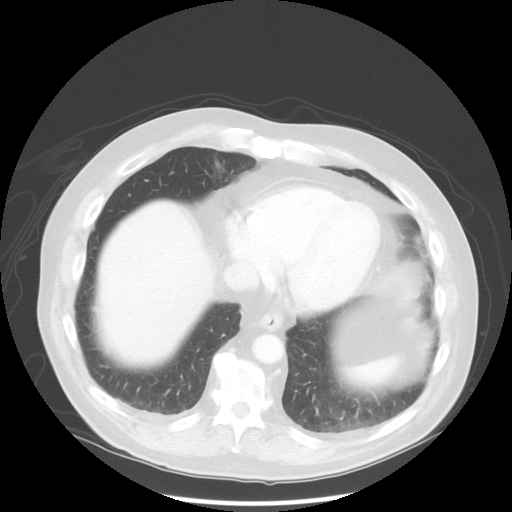

[Series 601: coronal body · coronal · 0.96mm/px · 1 of 138 slices shown, 2 images]
[im 46/138  soft-tissue]
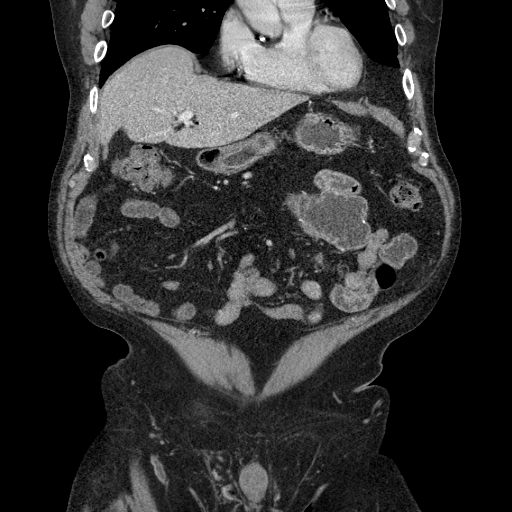
[im 46/138  bone]
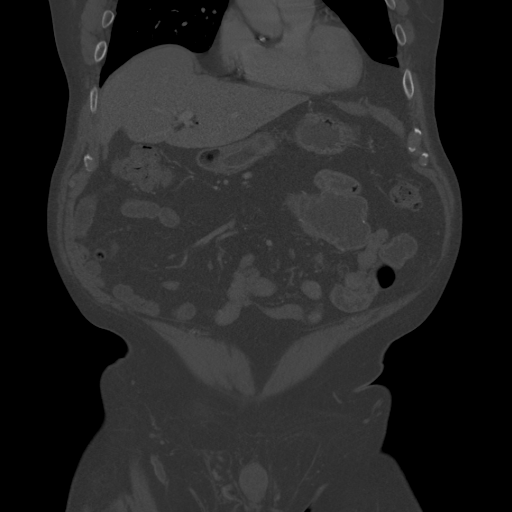

[Series 602: sagittal body · sagittal · 0.96mm/px · 4 of 172 slices shown]
[im 12/172  soft-tissue]
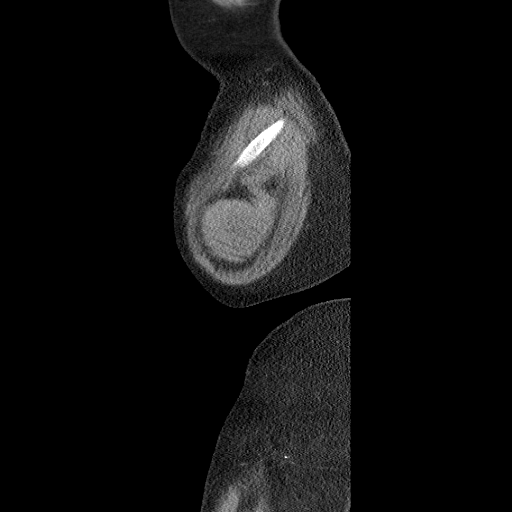
[im 35/172  soft-tissue]
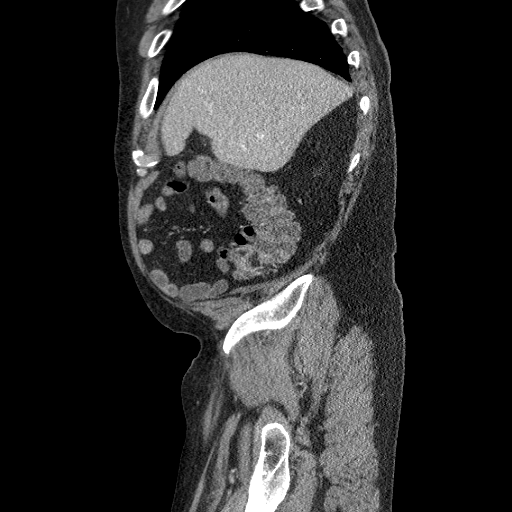
[im 58/172  soft-tissue]
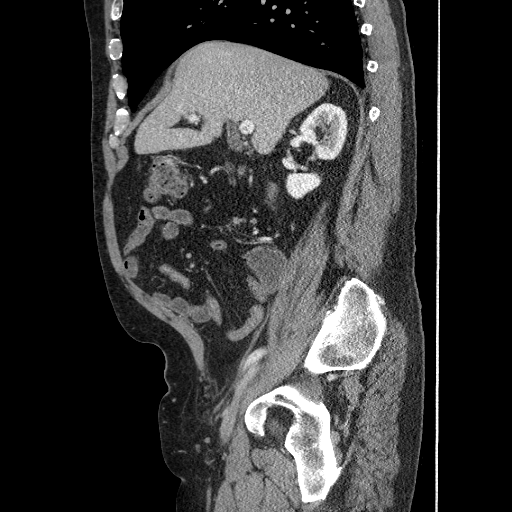
[im 80/172  soft-tissue]
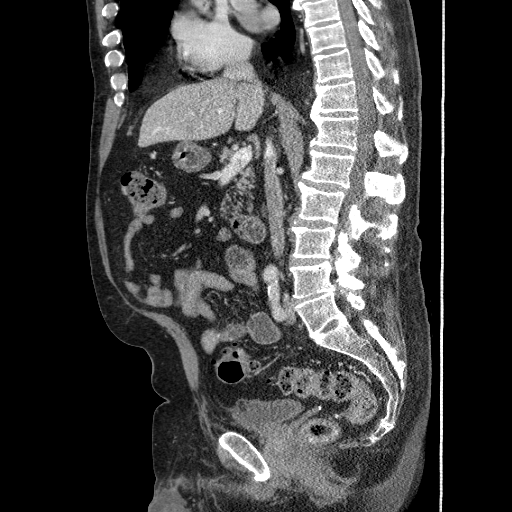

[12 of 36 positions shown; findings below may reference images not displayed]

FINDINGS: Lower chest: Lung bases clear

Hepatobiliary: Tiny focus of pneumobilia, consistent with patient
history of ERCP. Post cholecystectomy. No focal hepatic
abnormalities. No biliary dilatation.

Pancreas: Atrophic, otherwise unremarkable

Spleen: Normal appearance

Adrenals/Urinary Tract: Small cyst upper pole LEFT kidney. Adrenal
glands, kidneys, ureters, and bladder otherwise normal appearance.

Stomach/Bowel: Appendix surgically absent by history. Diverticulosis
of sigmoid colon without evidence of diverticulitis. Single
minimally prominent small bowel loop in the LEFT mid abdomen, 4.2 cm
diameter adjacent to an anastomotic staple line without definite
evidence of obstruction; this could represent physiologic dilatation
post surgery. Several small bowel loops extend into an umbilical
hernia without evidence of obstruction. Stomach and remaining bowel
loops otherwise normal appearance.

Vascular/Lymphatic: Atherosclerotic calcifications aorta, iliac
arteries, and coronary arteries.

Reproductive: N/A

Other: No free air or free fluid. LEFT inguinal hernia containing
fat. Umbilical hernia as above. No acute inflammatory process.

Musculoskeletal: Unremarkable
IMPRESSION: Sigmoid diverticulosis without evidence of diverticulitis.

Umbilical hernia containing nonobstructed small bowel loops.

Tiny foci of pneumobilia consistent with prior ERCP and
sphincterotomy.

Single segment of minimally distended small bowel in the LEFT mid
abdomen without evidence of obstruction, likely postsurgical in
origin.

LEFT inguinal hernia containing fat.

Coronary artery disease.

No acute intra-abdominal or intrapelvic abnormalities.

Aortic Atherosclerosis ([id number].O).

## 2019-03-10 DIAGNOSIS — Z79899 Other long term (current) drug therapy: Secondary | ICD-10-CM | POA: Diagnosis not present

## 2019-03-10 DIAGNOSIS — G894 Chronic pain syndrome: Secondary | ICD-10-CM | POA: Diagnosis not present

## 2019-03-10 DIAGNOSIS — Z029 Encounter for administrative examinations, unspecified: Secondary | ICD-10-CM | POA: Diagnosis not present

## 2019-03-10 DIAGNOSIS — M47816 Spondylosis without myelopathy or radiculopathy, lumbar region: Secondary | ICD-10-CM | POA: Diagnosis not present

## 2019-03-12 ENCOUNTER — Ambulatory Visit: Payer: PPO | Attending: Internal Medicine

## 2019-03-12 DIAGNOSIS — Z23 Encounter for immunization: Secondary | ICD-10-CM | POA: Insufficient documentation

## 2019-03-12 NOTE — Progress Notes (Signed)
   [id number]  Name:  Bertrum Nakano.    MRN: [id number] DOB: 1945-06-03  03/12/2019  Mr. Ledsome was observed post Covid-19 immunization for 15 minutes without incidence. He was provided with Vaccine Information Sheet and instruction to access the V-Safe system.   Mr. Dundee was instructed to call 911 with any severe reactions post vaccine: Marland Kitchen Difficulty breathing  . Swelling of your face and throat  . A fast heartbeat  . A bad rash all over your body  . Dizziness and weakness    Immunizations Administered    Name Date Dose VIS Date Route   Pfizer COVID-19 Vaccine 03/12/2019 11:52 AM 0.3 mL 01/30/2019 Intramuscular   Manufacturer: [place]   Lot: [id number]   [place]: [id number]

## 2019-03-13 DIAGNOSIS — M47816 Spondylosis without myelopathy or radiculopathy, lumbar region: Secondary | ICD-10-CM | POA: Diagnosis not present

## 2019-03-17 DIAGNOSIS — M47816 Spondylosis without myelopathy or radiculopathy, lumbar region: Secondary | ICD-10-CM | POA: Diagnosis not present

## 2019-03-20 DIAGNOSIS — M47816 Spondylosis without myelopathy or radiculopathy, lumbar region: Secondary | ICD-10-CM | POA: Diagnosis not present

## 2019-03-23 DIAGNOSIS — G4700 Insomnia, unspecified: Secondary | ICD-10-CM | POA: Diagnosis not present

## 2019-03-23 DIAGNOSIS — M109 Gout, unspecified: Secondary | ICD-10-CM | POA: Diagnosis not present

## 2019-03-23 DIAGNOSIS — M545 Low back pain: Secondary | ICD-10-CM | POA: Diagnosis not present

## 2019-03-23 DIAGNOSIS — R350 Frequency of micturition: Secondary | ICD-10-CM | POA: Diagnosis not present

## 2019-03-23 DIAGNOSIS — Z0000 Encounter for general adult medical examination without abnormal findings: Secondary | ICD-10-CM | POA: Diagnosis not present

## 2019-03-23 DIAGNOSIS — F418 Other specified anxiety disorders: Secondary | ICD-10-CM | POA: Diagnosis not present

## 2019-03-23 DIAGNOSIS — I10 Essential (primary) hypertension: Secondary | ICD-10-CM | POA: Diagnosis not present

## 2019-03-23 DIAGNOSIS — N401 Enlarged prostate with lower urinary tract symptoms: Secondary | ICD-10-CM | POA: Diagnosis not present

## 2019-03-23 DIAGNOSIS — G8929 Other chronic pain: Secondary | ICD-10-CM | POA: Diagnosis not present

## 2019-03-23 DIAGNOSIS — N529 Male erectile dysfunction, unspecified: Secondary | ICD-10-CM | POA: Diagnosis not present

## 2019-03-23 DIAGNOSIS — Z13220 Encounter for screening for lipoid disorders: Secondary | ICD-10-CM | POA: Diagnosis not present

## 2019-03-23 DIAGNOSIS — M47816 Spondylosis without myelopathy or radiculopathy, lumbar region: Secondary | ICD-10-CM | POA: Diagnosis not present

## 2019-03-26 DIAGNOSIS — M47816 Spondylosis without myelopathy or radiculopathy, lumbar region: Secondary | ICD-10-CM | POA: Diagnosis not present

## 2019-03-27 DIAGNOSIS — M47816 Spondylosis without myelopathy or radiculopathy, lumbar region: Secondary | ICD-10-CM | POA: Diagnosis not present

## 2019-03-31 DIAGNOSIS — N401 Enlarged prostate with lower urinary tract symptoms: Secondary | ICD-10-CM | POA: Diagnosis not present

## 2019-03-31 DIAGNOSIS — I10 Essential (primary) hypertension: Secondary | ICD-10-CM | POA: Diagnosis not present

## 2019-03-31 DIAGNOSIS — M109 Gout, unspecified: Secondary | ICD-10-CM | POA: Diagnosis not present

## 2019-03-31 DIAGNOSIS — E785 Hyperlipidemia, unspecified: Secondary | ICD-10-CM | POA: Diagnosis not present

## 2019-03-31 DIAGNOSIS — M47816 Spondylosis without myelopathy or radiculopathy, lumbar region: Secondary | ICD-10-CM | POA: Diagnosis not present

## 2019-03-31 DIAGNOSIS — R350 Frequency of micturition: Secondary | ICD-10-CM | POA: Diagnosis not present

## 2019-04-02 ENCOUNTER — Ambulatory Visit: Payer: PPO | Attending: Internal Medicine

## 2019-04-02 DIAGNOSIS — Z23 Encounter for immunization: Secondary | ICD-10-CM | POA: Insufficient documentation

## 2019-04-02 NOTE — Progress Notes (Signed)
   [id number] Vaccination [facility]  Name:  Jeffery Harvey.    MRN: [id number] DOB: 18-Nov-1945  04/02/2019  Jeffery Harvey was observed post Covid-19 immunization for 15 minutes without incidence. He was provided with Vaccine Information Sheet and instruction to access the V-Safe system.   Jeffery Harvey was instructed to call 911 with any severe reactions post vaccine: Marland Kitchen Difficulty breathing  . Swelling of your face and throat  . A fast heartbeat  . A bad rash all over your body  . Dizziness and weakness    Immunizations Administered    Name Date Dose VIS Date Route   Pfizer COVID-19 Vaccine 04/02/2019 11:19 AM 0.3 mL 01/30/2019 Intramuscular   Manufacturer: [employer]   Lot: [id number]   [place]: [id number]

## 2019-04-08 DIAGNOSIS — L570 Actinic keratosis: Secondary | ICD-10-CM | POA: Diagnosis not present

## 2019-04-08 DIAGNOSIS — D2262 Melanocytic nevi of left upper limb, including shoulder: Secondary | ICD-10-CM | POA: Diagnosis not present

## 2019-04-08 DIAGNOSIS — C44719 Basal cell carcinoma of skin of left lower limb, including hip: Secondary | ICD-10-CM | POA: Diagnosis not present

## 2019-04-08 DIAGNOSIS — L821 Other seborrheic keratosis: Secondary | ICD-10-CM | POA: Diagnosis not present

## 2019-04-08 DIAGNOSIS — D2271 Melanocytic nevi of right lower limb, including hip: Secondary | ICD-10-CM | POA: Diagnosis not present

## 2019-04-08 DIAGNOSIS — D485 Neoplasm of uncertain behavior of skin: Secondary | ICD-10-CM | POA: Diagnosis not present

## 2019-04-08 DIAGNOSIS — K130 Diseases of lips: Secondary | ICD-10-CM | POA: Diagnosis not present

## 2019-04-08 DIAGNOSIS — D2272 Melanocytic nevi of left lower limb, including hip: Secondary | ICD-10-CM | POA: Diagnosis not present

## 2019-04-08 DIAGNOSIS — M47816 Spondylosis without myelopathy or radiculopathy, lumbar region: Secondary | ICD-10-CM | POA: Diagnosis not present

## 2019-04-08 DIAGNOSIS — D225 Melanocytic nevi of trunk: Secondary | ICD-10-CM | POA: Diagnosis not present

## 2019-04-08 DIAGNOSIS — D2261 Melanocytic nevi of right upper limb, including shoulder: Secondary | ICD-10-CM | POA: Diagnosis not present

## 2019-04-08 DIAGNOSIS — D0471 Carcinoma in situ of skin of right lower limb, including hip: Secondary | ICD-10-CM | POA: Diagnosis not present

## 2019-04-08 DIAGNOSIS — L308 Other specified dermatitis: Secondary | ICD-10-CM | POA: Diagnosis not present

## 2019-04-08 DIAGNOSIS — D692 Other nonthrombocytopenic purpura: Secondary | ICD-10-CM | POA: Diagnosis not present

## 2019-04-10 DIAGNOSIS — M47816 Spondylosis without myelopathy or radiculopathy, lumbar region: Secondary | ICD-10-CM | POA: Diagnosis not present

## 2019-04-13 DIAGNOSIS — M47816 Spondylosis without myelopathy or radiculopathy, lumbar region: Secondary | ICD-10-CM | POA: Diagnosis not present

## 2019-04-14 DIAGNOSIS — E875 Hyperkalemia: Secondary | ICD-10-CM | POA: Diagnosis not present

## 2019-04-20 DIAGNOSIS — M47816 Spondylosis without myelopathy or radiculopathy, lumbar region: Secondary | ICD-10-CM | POA: Diagnosis not present

## 2019-04-22 DIAGNOSIS — M47816 Spondylosis without myelopathy or radiculopathy, lumbar region: Secondary | ICD-10-CM | POA: Diagnosis not present

## 2019-04-23 DIAGNOSIS — G8929 Other chronic pain: Secondary | ICD-10-CM | POA: Diagnosis not present

## 2019-04-23 DIAGNOSIS — M545 Low back pain: Secondary | ICD-10-CM | POA: Diagnosis not present

## 2019-04-23 DIAGNOSIS — N529 Male erectile dysfunction, unspecified: Secondary | ICD-10-CM | POA: Diagnosis not present

## 2019-04-23 DIAGNOSIS — I10 Essential (primary) hypertension: Secondary | ICD-10-CM | POA: Diagnosis not present

## 2019-04-27 DIAGNOSIS — M47816 Spondylosis without myelopathy or radiculopathy, lumbar region: Secondary | ICD-10-CM | POA: Diagnosis not present

## 2019-04-29 DIAGNOSIS — M47816 Spondylosis without myelopathy or radiculopathy, lumbar region: Secondary | ICD-10-CM | POA: Diagnosis not present

## 2019-05-05 DIAGNOSIS — D0471 Carcinoma in situ of skin of right lower limb, including hip: Secondary | ICD-10-CM | POA: Diagnosis not present

## 2019-05-05 DIAGNOSIS — C44719 Basal cell carcinoma of skin of left lower limb, including hip: Secondary | ICD-10-CM | POA: Diagnosis not present

## 2019-05-12 DIAGNOSIS — M47816 Spondylosis without myelopathy or radiculopathy, lumbar region: Secondary | ICD-10-CM | POA: Diagnosis not present

## 2019-05-25 DIAGNOSIS — N529 Male erectile dysfunction, unspecified: Secondary | ICD-10-CM | POA: Diagnosis not present

## 2019-05-25 DIAGNOSIS — G8929 Other chronic pain: Secondary | ICD-10-CM | POA: Diagnosis not present

## 2019-05-25 DIAGNOSIS — I10 Essential (primary) hypertension: Secondary | ICD-10-CM | POA: Diagnosis not present

## 2019-05-25 DIAGNOSIS — M545 Low back pain: Secondary | ICD-10-CM | POA: Diagnosis not present

## 2019-07-16 DIAGNOSIS — N529 Male erectile dysfunction, unspecified: Secondary | ICD-10-CM | POA: Diagnosis not present

## 2019-07-16 DIAGNOSIS — N528 Other male erectile dysfunction: Secondary | ICD-10-CM | POA: Diagnosis not present

## 2019-07-24 DIAGNOSIS — N528 Other male erectile dysfunction: Secondary | ICD-10-CM | POA: Diagnosis not present

## 2019-07-24 DIAGNOSIS — E291 Testicular hypofunction: Secondary | ICD-10-CM | POA: Diagnosis not present

## 2019-08-27 DIAGNOSIS — N529 Male erectile dysfunction, unspecified: Secondary | ICD-10-CM | POA: Diagnosis not present

## 2019-08-27 DIAGNOSIS — R351 Nocturia: Secondary | ICD-10-CM | POA: Diagnosis not present

## 2019-09-16 DIAGNOSIS — N529 Male erectile dysfunction, unspecified: Secondary | ICD-10-CM | POA: Diagnosis not present

## 2019-10-05 DIAGNOSIS — N401 Enlarged prostate with lower urinary tract symptoms: Secondary | ICD-10-CM | POA: Diagnosis not present

## 2019-10-05 DIAGNOSIS — I10 Essential (primary) hypertension: Secondary | ICD-10-CM | POA: Diagnosis not present

## 2019-10-05 DIAGNOSIS — F5101 Primary insomnia: Secondary | ICD-10-CM | POA: Diagnosis not present

## 2019-10-05 DIAGNOSIS — N529 Male erectile dysfunction, unspecified: Secondary | ICD-10-CM | POA: Diagnosis not present

## 2019-10-05 DIAGNOSIS — R350 Frequency of micturition: Secondary | ICD-10-CM | POA: Diagnosis not present

## 2019-10-05 DIAGNOSIS — E663 Overweight: Secondary | ICD-10-CM | POA: Diagnosis not present

## 2019-10-05 DIAGNOSIS — R7989 Other specified abnormal findings of blood chemistry: Secondary | ICD-10-CM | POA: Diagnosis not present

## 2019-10-20 NOTE — Progress Notes (Signed)
Cardiology Office Note:    Date:  10/22/2019   ID:  Jeffery Shivers., DOB 1945/07/24, MRN [id number]  PCP:  Chesley Noon, MD  Cardiologist:  Lache Dagher Martinique, MD  Electrophysiologist:  None   Referring MD: Chesley Noon, MD   Chief Complaint  Patient presents with  . Hypertension    History of Present Illness:    Jeffery Felch. is a 74 y.o. male with a hx of HTN, GERD, and chronic back pain.  Myoview obtained on 06/20/2009 showed normal perfusion.  Last echocardiogram in 06/2009 showed normal EF 55%, mild MR.   On follow up today he is doing very well. BP is well controlled. States he is eating healthier now. 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 He has weaned off Cymalta and is on a lower dose of oxycontin.   Past Medical History:  Diagnosis Date  . Arthritis    narrowing and disc degeneration of lower back"chroin pain"  . Cancer ([place])    skin cancer  . Chronic back pain    chronic back pain- see pain management MD  . Diverticulitis    had tiny perforation of bowel- 04/2014  . GI bleed 2008  . Gout   . Hypertension   . PONV (postoperative nausea and vomiting)    better with recent surgeries  . Swallowing difficulty    can swallow, but often times has to spit up" bring up a lot of foamy tough"  . Transfusion history    x2 episodes -post procedure polypectomies at different times under 2 yrs ago    Past Surgical History:  Procedure Laterality Date  . APPENDECTOMY     open,"rupture"  . BOWEL RESECTION N/A 09/29/2015   Procedure: SMALL BOWEL RESECTION;  Surgeon: Greer Pickerel, MD;  Location: [place];  Service: General;  Laterality: N/A;  . CERVICAL FUSION  2000  . CHOLECYSTECTOMY    . CHOLECYSTECTOMY, LAPAROSCOPIC  8/15  . COLONOSCOPY W/ BIOPSIES AND POLYPECTOMY    . ERCP N/A 06/02/2014   Procedure: ENDOSCOPIC RETROGRADE CHOLANGIOPANCREATOGRAPHY (ERCP);  Surgeon: Arta Silence, MD;  Location: [facility];  Service: Endoscopy;   Laterality: N/A;  . ERCP N/A 06/29/2014   Procedure: ENDOSCOPIC RETROGRADE CHOLANGIOPANCREATOGRAPHY (ERCP);  Surgeon: Arta Silence, MD;  Location: [facility];  Service: Endoscopy;  Laterality: N/A;  . ESOPHAGOGASTRODUODENOSCOPY (EGD) WITH PROPOFOL N/A 08/04/2014   Procedure: ESOPHAGOGASTRODUODENOSCOPY (EGD) WITH PROPOFOL;  Surgeon: Arta Silence, MD;  [facility];  Service: Endoscopy;  Laterality: N/A;  . EUS N/A 05/26/2014   Procedure: FULL UPPER ENDOSCOPIC ULTRASOUND (EUS) RADIAL;  Surgeon: Arta Silence, MD;  [facility];  Service: Endoscopy;  Laterality: N/A;  . EUS N/A 06/29/2014   Procedure: UPPER ENDOSCOPIC ULTRASOUND (EUS) RADIAL;  Surgeon: Arta Silence, MD;  [facility];  Service: Endoscopy;  Laterality: N/A;  . HERNIA REPAIR     Bilateral  . INCISIONAL HERNIA REPAIR N/A 09/04/2016   Procedure: LAPAROSCOPIC REPAIR INCISIONAL HERNIA WITH MESH;  Surgeon: Greer Pickerel, MD;  [facility];  Service: General;  Laterality: N/A;  . INSERTION OF MESH N/A 09/04/2016   Procedure: INSERTION OF MESH;  Surgeon: Greer Pickerel, MD;  Location: [facility];  Service: General;  Laterality: N/A;  . LAPAROSCOPY N/A 09/29/2015   Procedure: LAPAROSCOPY DIAGNOSTIC;  Surgeon: Greer Pickerel, MD;  Location: [place];  Service: General;  Laterality: N/A;  . LAPAROTOMY N/A 09/29/2015   Procedure: EXPLORATORY LAPAROTOMY;  Surgeon: Greer Pickerel, MD;  Location:  [facility];  Service: General;  Laterality: N/A;    Current Medications: Current Meds  Medication Sig  . carvedilol (COREG) 12.5 MG tablet TAKE 1 TABLET BY MOUTH TWICE A DAY  . docusate sodium (COLACE) 100 MG capsule Take 100 mg by mouth daily.  . febuxostat (ULORIC) 40 MG tablet Take 40 mg by mouth daily.  Marland Kitchen losartan (COZAAR) 25 MG tablet Take 1 tablet (25 mg total) by mouth daily.  . naproxen sodium (ANAPROX) 220 MG tablet Take 440 mg by mouth daily as needed (for pain.).  Marland Kitchen oxyCODONE (ROXICODONE) 15 MG immediate release tablet Take  15 mg by mouth every 12 (twelve) hours.  . tamsulosin (FLOMAX) 0.4 MG CAPS capsule Take 0.4 mg by mouth daily after supper.  . zolpidem (AMBIEN) 10 MG tablet Take 10 mg by mouth at bedtime.     Allergies:   Flagyl [metronidazole], Amlodipine, Penicillins, Allopurinol, Amlodipine besy-benazepril hcl, Ancef [cefazolin], Dexilant [dexlansoprazole], and Vioxx [rofecoxib]   Social History   Socioeconomic History  . Marital status: Divorced    Spouse name: Not on file  . Number of children: Not on file  . Years of education: Not on file  . Highest education level: Not on file  Occupational History  . Not on file  Tobacco Use  . Smoking status: Never Smoker  . Smokeless tobacco: Never Used  Vaping Use  . Vaping Use: Never used  Substance and Sexual Activity  . Alcohol use: No    Alcohol/week: 5.0 standard drinks    Types: 5 Standard drinks or equivalent per week    Comment: none in 2 years  . Drug use: No  . Sexual activity: Not Currently  Other Topics Concern  . Not on file  Social History Narrative  . Not on file   Social Determinants of Health   Financial Resource Strain:   . Difficulty of Paying Living Expenses: Not on file  Food Insecurity:   . Worried About Charity fundraiser in the Last Year: Not on file  . Ran Out of Food in the Last Year: Not on file  Transportation Needs:   . Lack of Transportation (Medical): Not on file  . Lack of Transportation (Non-Medical): Not on file  Physical Activity:   . Days of Exercise per Week: Not on file  . Minutes of Exercise per Session: Not on file  Stress:   . Feeling of Stress : Not on file  Social Connections:   . Frequency of Communication with Friends and Family: Not on file  . Frequency of Social Gatherings with Friends and Family: Not on file  . Attends Religious Services: Not on file  . Active Member of Clubs or Organizations: Not on file  . Attends Archivist Meetings: Not on file  . Marital Status: Not on  file     Family History: The patient's family history includes Colon cancer in his mother; Heart failure (age of onset: 29) in his mother; Hypertension (age of onset: 29) in his sister; Other (age of onset: 64) in his father.  ROS:   Please see the history of present illness.     All other systems reviewed and are negative.  EKGs/Labs/Other Studies Reviewed:    The following studies were reviewed today:  Recent Labs: No results found for requested labs within last 8760 hours.  Recent Lipid Panel    Component Value Date/Time   CHOL 146 02/22/2017 0855   TRIG 239 (H) 02/22/2017 0855   HDL  31 (L) 02/22/2017 0855   CHOLHDL 4.7 02/22/2017 0855   LDLCALC 67 02/22/2017 0855   Dated 03/31/19 ; cholesterol 147, triglycerides 286, HDL 30, LDL 71. CMET normal.   Physical Exam:    VS:  BP 120/60   Pulse (!) 57   Ht 6' 2.5" (1.892 m)   Wt 216 lb 6.4 oz (98.2 kg)   SpO2 98%   BMI 27.41 kg/m     Wt Readings from Last 3 Encounters:  10/22/19 216 lb 6.4 oz (98.2 kg)  02/26/19 230 lb (104.3 kg)  11/14/18 221 lb (100.2 kg)     GEN:  Well nourished, well developed in no acute distress HEENT: Normal NECK: No JVD; No carotid bruits LYMPHATICS: No lymphadenopathy CARDIAC: RRR, no murmurs, rubs, gallops RESPIRATORY:  Clear to auscultation without rales, wheezing or rhonchi  ABDOMEN: Soft, non-tender, non-distended MUSCULOSKELETAL:  No edema; No deformity  SKIN: Warm and dry NEUROLOGIC:  Alert and oriented x 3 PSYCHIATRIC:  Normal affect   ASSESSMENT:    1. Essential hypertension   2. Hypertriglyceridemia    PLAN:    In order of problems listed above:  1. Hypertension: Blood pressure is well  Controlled. Continue current therapy  2. Hypertriglyceridemia: focus on dietary modification and weight control.  Follow up in one year.   Medication Adjustments/Labs and Tests Ordered: Current medicines are reviewed at length with the patient today.  Concerns regarding medicines  are outlined above.  No orders of the defined types were placed in this encounter.  No orders of the defined types were placed in this encounter.   There are no Patient Instructions on file for this visit.   Signed, Sade Mehlhoff Martinique, MD  10/22/2019 4:17 PM    [place]

## 2019-10-22 ENCOUNTER — Other Ambulatory Visit: Payer: Self-pay

## 2019-10-22 ENCOUNTER — Ambulatory Visit: Payer: PPO | Admitting: Cardiology

## 2019-10-22 ENCOUNTER — Encounter: Payer: Self-pay | Admitting: Cardiology

## 2019-10-22 VITALS — BP 120/60 | HR 57 | Ht 74.5 in | Wt 216.4 lb

## 2019-10-22 DIAGNOSIS — E781 Pure hyperglyceridemia: Secondary | ICD-10-CM | POA: Diagnosis not present

## 2019-10-22 DIAGNOSIS — I10 Essential (primary) hypertension: Secondary | ICD-10-CM | POA: Diagnosis not present

## 2020-01-13 ENCOUNTER — Other Ambulatory Visit: Payer: Self-pay | Admitting: Cardiology

## 2020-01-28 DIAGNOSIS — M1A09X0 Idiopathic chronic gout, multiple sites, without tophus (tophi): Secondary | ICD-10-CM | POA: Diagnosis not present

## 2020-01-28 DIAGNOSIS — Z6827 Body mass index (BMI) 27.0-27.9, adult: Secondary | ICD-10-CM | POA: Diagnosis not present

## 2020-01-28 DIAGNOSIS — M150 Primary generalized (osteo)arthritis: Secondary | ICD-10-CM | POA: Diagnosis not present

## 2020-01-28 DIAGNOSIS — E663 Overweight: Secondary | ICD-10-CM | POA: Diagnosis not present

## 2020-01-28 DIAGNOSIS — M5136 Other intervertebral disc degeneration, lumbar region: Secondary | ICD-10-CM | POA: Diagnosis not present

## 2020-03-11 ENCOUNTER — Other Ambulatory Visit: Payer: Self-pay | Admitting: Physician Assistant

## 2020-10-23 ENCOUNTER — Other Ambulatory Visit: Payer: Self-pay | Admitting: Cardiology

## 2021-03-15 ENCOUNTER — Other Ambulatory Visit: Payer: Self-pay | Admitting: Cardiology

## 2021-04-07 ENCOUNTER — Other Ambulatory Visit: Payer: Self-pay | Admitting: Cardiology

## 2021-04-25 ENCOUNTER — Other Ambulatory Visit: Payer: Self-pay | Admitting: Cardiology

## 2021-06-02 ENCOUNTER — Other Ambulatory Visit: Payer: Self-pay | Admitting: Cardiology

## 2021-06-05 ENCOUNTER — Telehealth: Payer: Self-pay | Admitting: Cardiology

## 2021-06-05 NOTE — Telephone Encounter (Signed)
?*  STAT* If patient is at the pharmacy, call can be transferred to refill team. ? ? ?1. Which medications need to be refilled? (please list name of each medication and dose if known)  ?losartan (COZAAR) 25 MG tablet ? ?2. Which pharmacy/location (including street and city if local pharmacy) is medication to be sent to? [id number]- [place], [place] - [address] ? ?3. Do they need a 30 day or 90 day supply? 30 day ? ? ?Patient has an appointment 6/15 ?

## 2021-06-08 ENCOUNTER — Other Ambulatory Visit: Payer: Self-pay

## 2021-06-08 MED ORDER — LOSARTAN POTASSIUM 25 MG PO TABS: 25.0000 mg | ORAL_TABLET | Freq: Every day | ORAL | 2 refills | Status: DC

## 2021-06-08 NOTE — Telephone Encounter (Signed)
Called patient. Medication refill sent to pharmacy. ?

## 2021-08-03 ENCOUNTER — Ambulatory Visit: Payer: PPO | Admitting: Cardiology

## 2021-09-07 NOTE — Progress Notes (Signed)
Cardiology Office Note:    Date:  09/08/2021   ID:  Jeffery Harvey., DOB 1945-05-06, MRN [id number]  PCP:  Chesley Noon, MD  Cardiologist:  Bracha Frankowski [place], MD  Electrophysiologist:  None   Referring MD: Chesley Noon, MD   Chief Complaint  Patient presents with   Hypertension    History of Present Illness:    Jeffery Harvey. is a 76 y.o. male with a hx of HTN, GERD, and chronic back pain.  Myoview obtained on 06/20/2009 showed normal perfusion.  Last echocardiogram in 06/2009 showed normal EF 55%, mild MR.   On follow up today he is doing very well. BP is well controlled. States he is eating healthier now. 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 No chest pain or SOB. Tolerating medication well.   Past Medical History:  Diagnosis Date   Arthritis    narrowing and disc degeneration of lower back"chroin pain"   Cancer ([place])    skin cancer   Chronic back pain    chronic back pain- see pain management MD   Diverticulitis    had tiny perforation of bowel- 04/2014   GI bleed 2008   Gout    Hypertension    PONV (postoperative nausea and vomiting)    better with recent surgeries   Swallowing difficulty    can swallow, but often times has to spit up" bring up a lot of foamy tough"   Transfusion history    x2 episodes -post procedure polypectomies at different times under 2 yrs ago    Past Surgical History:  Procedure Laterality Date   APPENDECTOMY     open,"rupture"   BOWEL RESECTION N/A 09/29/2015   Procedure: SMALL BOWEL RESECTION;  Surgeon: Greer Pickerel, MD;  Location: [facility];  Service: General;  Laterality: N/A;   CERVICAL FUSION  2000   CHOLECYSTECTOMY     CHOLECYSTECTOMY, LAPAROSCOPIC  8/15   COLONOSCOPY W/ BIOPSIES AND POLYPECTOMY     ERCP N/A 06/02/2014   Procedure: ENDOSCOPIC RETROGRADE CHOLANGIOPANCREATOGRAPHY (ERCP);  Surgeon: Arta Silence, MD;  Location: [facility];  Service: Endoscopy;  Laterality: N/A;   ERCP N/A  06/29/2014   Procedure: ENDOSCOPIC RETROGRADE CHOLANGIOPANCREATOGRAPHY (ERCP);  Surgeon: Arta Silence, MD;  Location: [facility];  Service: Endoscopy;  Laterality: N/A;   ESOPHAGOGASTRODUODENOSCOPY (EGD) WITH PROPOFOL N/A 08/04/2014   Procedure: ESOPHAGOGASTRODUODENOSCOPY (EGD) WITH PROPOFOL;  Surgeon: Arta Silence, MD;  Location: [facility];  Service: Endoscopy;  Laterality: N/A;   EUS N/A 05/26/2014   Procedure: FULL UPPER ENDOSCOPIC ULTRASOUND (EUS) RADIAL;  Surgeon: Arta Silence, MD;  Location: [facility];  Service: Endoscopy;  Laterality: N/A;   EUS N/A 06/29/2014   Procedure: UPPER ENDOSCOPIC ULTRASOUND (EUS) RADIAL;  Surgeon: Arta Silence, MD;  Location: [facility];  Service: Endoscopy;  Laterality: N/A;   HERNIA REPAIR     Bilateral   INCISIONAL HERNIA REPAIR N/A 09/04/2016   Procedure: LAPAROSCOPIC REPAIR INCISIONAL HERNIA WITH MESH;  Surgeon: Greer Pickerel, MD;  Location: [facility];  Service: General;  Laterality: N/A;   INSERTION OF MESH N/A 09/04/2016   Procedure: INSERTION OF MESH;  Surgeon: Greer Pickerel, MD;  Location: [facility];  Service: General;  Laterality: N/A;   LAPAROSCOPY N/A 09/29/2015   Procedure: LAPAROSCOPY DIAGNOSTIC;  Surgeon: Greer Pickerel, MD;  Location: [facility];  Service: General;  Laterality: N/A;   LAPAROTOMY N/A 09/29/2015   Procedure: EXPLORATORY LAPAROTOMY;  Surgeon: Greer Pickerel, MD;  Location: [facility];  Service: General;  Laterality: N/A;    Current Medications: Current Meds  Medication Sig   carvedilol (COREG) 12.5 MG tablet TAKE 1 TABLET BY MOUTH TWICE A DAY   docusate sodium (COLACE) 100 MG capsule Take 100 mg by mouth daily.   febuxostat (ULORIC) 40 MG tablet Take 40 mg by mouth daily.   losartan (COZAAR) 25 MG tablet Take 1 tablet (25 mg total) by mouth daily.   naproxen sodium (ANAPROX) 220 MG tablet Take 440 mg by mouth daily as needed (for pain.).   tamsulosin (FLOMAX) 0.4 MG CAPS capsule Take 0.4 mg by mouth daily after supper.   zolpidem (AMBIEN)  10 MG tablet Take 10 mg by mouth at bedtime.     Allergies:   Flagyl [metronidazole], Amlodipine, Penicillins, Allopurinol, Amlodipine besy-benazepril hcl, Ancef [cefazolin], Dexilant [dexlansoprazole], and Vioxx [rofecoxib]   Social History   Socioeconomic History   Marital status: Divorced    Spouse name: Not on file   Number of children: Not on file   Years of education: Not on file   Highest education level: Not on file  Occupational History   Not on file  Tobacco Use   Smoking status: Never   Smokeless tobacco: Never  Vaping Use   Vaping Use: Never used  Substance and Sexual Activity   Alcohol use: No    Alcohol/week: 5.0 standard drinks of alcohol    Types: 5 Standard drinks or equivalent per week    Comment: none in 2 years   Drug use: No   Sexual activity: Not Currently  Other Topics Concern   Not on file  Social History Narrative   Not on file   Social Determinants of Health   Financial Resource Strain: Not on file  Food Insecurity: Not on file  Transportation Needs: Not on file  Physical Activity: Not on file  Stress: Not on file  Social Connections: Not on file     Family History: The patient's family history includes Colon cancer in his mother; Heart failure (age of onset: 53) in his mother; Hypertension (age of onset: 28) in his sister; Other (age of onset: 63) in his father.  ROS:   Please see the history of present illness.     All other systems reviewed and are negative.  EKGs/Labs/Other Studies Reviewed:    The following studies were reviewed today:  Ecg today shows NSR rate 55. Normal. I have personally reviewed and interpreted this study.   Recent Labs: No results found for requested labs within last 365 days.  Recent Lipid Panel    Component Value Date/Time   CHOL 146 02/22/2017 0855   TRIG 239 (H) 02/22/2017 0855   HDL 31 (L) 02/22/2017 0855   CHOLHDL 4.7 02/22/2017 0855   LDLCALC 67 02/22/2017 0855   Dated 03/31/19 ; cholesterol  147, triglycerides 286, HDL 30, LDL 71. CMET normal. Dated 04/10/21: cholesterol 132, triglycerides 97, HDL 35, LDL 79. CMET is normal.  Physical Exam:    VS:  BP 122/74 (BP Location: Left Arm, Patient Position: Sitting, Cuff Size: Large)   Pulse (!) 55   Ht 6' 2.5" (1.892 m)   Wt 211 lb (95.7 kg)   SpO2 94%   BMI 26.73 kg/m     Wt Readings from Last 3 Encounters:  09/08/21 211 lb (95.7 kg)  10/22/19 216 lb 6.4 oz (98.2 kg)  02/26/19 230 lb (104.3 kg)     GEN:  Well nourished, well developed in no acute distress HEENT: Normal NECK: No JVD;  No carotid bruits LYMPHATICS: No lymphadenopathy CARDIAC: RRR, no murmurs, rubs, gallops RESPIRATORY:  Clear to auscultation without rales, wheezing or rhonchi  ABDOMEN: Soft, non-tender, non-distended MUSCULOSKELETAL:  No edema; No deformity  SKIN: Warm and dry NEUROLOGIC:  Alert and oriented x 3 PSYCHIATRIC:  Normal affect   ASSESSMENT:    1. Essential hypertension   2. Hypertriglyceridemia     PLAN:    In order of problems listed above:  Hypertension: Blood pressure is well  Controlled. Continue current therapy. Rx refilled.   Hypertriglyceridemia: focus on dietary modification and weight control. Much better. Last triglycerides 97.  Follow up in one year.   Medication Adjustments/Labs and Tests Ordered: Current medicines are reviewed at length with the patient today.  Concerns regarding medicines are outlined above.  No orders of the defined types were placed in this encounter.  No orders of the defined types were placed in this encounter.   There are no Patient Instructions on file for this visit.   Signed, Corretta Munce [place], MD  09/08/2021 8:36 AM    [facility]

## 2021-09-08 ENCOUNTER — Ambulatory Visit (INDEPENDENT_AMBULATORY_CARE_PROVIDER_SITE_OTHER): Payer: Medicare Other | Admitting: Cardiology

## 2021-09-08 ENCOUNTER — Encounter: Payer: Self-pay | Admitting: Cardiology

## 2021-09-08 VITALS — BP 122/74 | HR 55 | Ht 74.5 in | Wt 211.0 lb

## 2021-09-08 DIAGNOSIS — E781 Pure hyperglyceridemia: Secondary | ICD-10-CM

## 2021-09-08 DIAGNOSIS — I10 Essential (primary) hypertension: Secondary | ICD-10-CM | POA: Diagnosis not present

## 2021-09-08 MED ORDER — LOSARTAN POTASSIUM 25 MG PO TABS: 25.0000 mg | ORAL_TABLET | Freq: Every day | ORAL | 3 refills | Status: DC

## 2021-09-08 MED ORDER — CARVEDILOL 12.5 MG PO TABS: 12.5000 mg | ORAL_TABLET | Freq: Two times a day (BID) | ORAL | 2 refills | Status: DC

## 2022-03-28 DIAGNOSIS — M79644 Pain in right finger(s): Secondary | ICD-10-CM | POA: Insufficient documentation

## 2022-03-28 DIAGNOSIS — M189 Osteoarthritis of first carpometacarpal joint, unspecified: Secondary | ICD-10-CM | POA: Insufficient documentation

## 2022-05-22 ENCOUNTER — Other Ambulatory Visit: Payer: Self-pay | Admitting: Cardiology

## 2022-08-25 ENCOUNTER — Other Ambulatory Visit: Payer: Self-pay | Admitting: Cardiology

## 2022-09-19 ENCOUNTER — Other Ambulatory Visit: Payer: Self-pay | Admitting: Cardiology

## 2022-09-21 DIAGNOSIS — M1991 Primary osteoarthritis, unspecified site: Secondary | ICD-10-CM | POA: Insufficient documentation

## 2022-09-29 ENCOUNTER — Other Ambulatory Visit: Payer: Self-pay | Admitting: Cardiology

## 2022-11-14 ENCOUNTER — Other Ambulatory Visit: Payer: Self-pay | Admitting: Cardiology

## 2022-11-26 ENCOUNTER — Other Ambulatory Visit: Payer: Self-pay | Admitting: Cardiology

## 2022-12-21 ENCOUNTER — Other Ambulatory Visit: Payer: Self-pay | Admitting: Cardiology

## 2023-01-04 ENCOUNTER — Other Ambulatory Visit: Payer: Self-pay | Admitting: Cardiology

## 2023-03-12 NOTE — Progress Notes (Deleted)
Cardiology Office Note:    Date:  03/12/2023   ID:  Jeffery Triplett., DOB 12-Jan-1946, MRN [id number]  PCP:  Eartha Inch, MD  Cardiologist:  Jakiya Bookbinder [place], MD  Electrophysiologist:  None   Referring MD: Eartha Inch, MD   No chief complaint on file.   History of Present Illness:    Jeffery Winiarski. is a 78 y.o. male with a hx of HTN, GERD, and chronic back pain.  Myoview obtained on 06/20/2009 showed normal perfusion.  Last echocardiogram in 06/2009 showed normal EF 55%, mild MR.   On follow up today he is doing very well. BP is well controlled. States he is eating healthier now. 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 No chest pain or SOB. Tolerating medication well.   Past Medical History:  Diagnosis Date   Arthritis    narrowing and disc degeneration of lower back"chroin pain"   Cancer ([facility])    skin cancer   Chronic back pain    chronic back pain- see pain management MD   Diverticulitis    had tiny perforation of bowel- 04/2014   GI bleed 2008   Gout    Hypertension    PONV (postoperative nausea and vomiting)    better with recent surgeries   Swallowing difficulty    can swallow, but often times has to spit up" bring up a lot of foamy tough"   Transfusion history    x2 episodes -post procedure polypectomies at different times under 2 yrs ago    Past Surgical History:  Procedure Laterality Date   APPENDECTOMY     open,"rupture"   BOWEL RESECTION N/A 09/29/2015   Procedure: SMALL BOWEL RESECTION;  Surgeon: Gaynelle Adu, MD;  Location: [facility];  Service: General;  Laterality: N/A;   CERVICAL FUSION  2000   CHOLECYSTECTOMY     CHOLECYSTECTOMY, LAPAROSCOPIC  8/15   COLONOSCOPY W/ BIOPSIES AND POLYPECTOMY     ERCP N/A 06/02/2014   Procedure: ENDOSCOPIC RETROGRADE CHOLANGIOPANCREATOGRAPHY (ERCP);  Surgeon: Willis Modena, MD;  Location: [facility];  Service: Endoscopy;  Laterality: N/A;   ERCP N/A 06/29/2014   Procedure:  ENDOSCOPIC RETROGRADE CHOLANGIOPANCREATOGRAPHY (ERCP);  Surgeon: Willis Modena, MD;  Location: [facility];  Service: Endoscopy;  Laterality: N/A;   ESOPHAGOGASTRODUODENOSCOPY (EGD) WITH PROPOFOL N/A 08/04/2014   Procedure: ESOPHAGOGASTRODUODENOSCOPY (EGD) WITH PROPOFOL;  Surgeon: Willis Modena, MD;  [facility];  Service: Endoscopy;  Laterality: N/A;   EUS N/A 05/26/2014   Procedure: FULL UPPER ENDOSCOPIC ULTRASOUND (EUS) RADIAL;  Surgeon: Willis Modena, MD;  Location[facility];  Service: Endoscopy;  Laterality: N/A;   EUS N/A 06/29/2014   Procedure: UPPER ENDOSCOPIC ULTRASOUND (EUS) RADIAL;  Surgeon: Willis Modena, MD;  Location: [facility];  Service: Endoscopy;  Laterality: N/A;   HERNIA REPAIR     Bilateral   INCISIONAL HERNIA REPAIR N/A 09/04/2016   Procedure: LAPAROSCOPIC REPAIR INCISIONAL HERNIA WITH MESH;  Surgeon: Gaynelle Adu, MD;  Location: [facility];  Service: General;  Laterality: N/A;   INSERTION OF MESH N/A 09/04/2016   Procedure: INSERTION OF MESH;  Surgeon: Gaynelle Adu, MD;  Location: [facility];  Service: General;  Laterality: N/A;   LAPAROSCOPY N/A 09/29/2015   Procedure: LAPAROSCOPY DIAGNOSTIC;  Surgeon: Gaynelle Adu, MD;  Location[facility];  Service: General;  Laterality: N/A;   LAPAROTOMY N/A 09/29/2015   Procedure: EXPLORATORY LAPAROTOMY;  Surgeon: Gaynelle Adu, MD;  Location: [facility] OR;  Service: General;  Laterality: N/A;  Current Medications: No outpatient medications have been marked as taking for the 03/13/23 encounter (Appointment) with Harvey, Jeffery Draheim M, MD.     Allergies:   Flagyl [metronidazole], Amlodipine, Penicillins, Allopurinol, Amlodipine besy-benazepril hcl, Ancef [cefazolin], Dexilant [dexlansoprazole], and Vioxx [rofecoxib]   Social History   Socioeconomic History   Marital status: Divorced    Spouse name: Not on file   Number of children: Not on file   Years of education: Not on file   Highest education level: Not on file  Occupational History    Not on file  Tobacco Use   Smoking status: Never   Smokeless tobacco: Never  Vaping Use   Vaping status: Never Used  Substance and Sexual Activity   Alcohol use: No    Alcohol/week: 5.0 standard drinks of alcohol    Types: 5 Standard drinks or equivalent per week    Comment: none in 2 years   Drug use: No   Sexual activity: Not Currently  Other Topics Concern   Not on file  Social History Narrative   Not on file   Social Drivers of Health   Financial Resource Strain: Low Risk  (12/23/2022)   Received from [facility]   Overall Financial Resource Strain (CARDIA)    Difficulty of Paying Living Expenses: Not hard at all  Food Insecurity: No Food Insecurity (12/23/2022)   Received from [facility]   Hunger Vital Sign    Worried About Running Out of Food in the Last Year: Never true    Ran Out of Food in the Last Year: Never true  Transportation Needs: No Transportation Needs (12/23/2022)   Received from [facility] - Transportation    Lack of Transportation (Medical): No    Lack of Transportation (Non-Medical): No  Physical Activity: Insufficiently Active (12/23/2022)   Received from [facility]   Exercise Vital Sign    Days of Exercise per Week: 3 days    Minutes of Exercise per Session: 20 min  Stress: No Stress Concern Present (12/23/2022)   Received from [facility] - Occupational Stress Questionnaire    Feeling of Stress : Not at all  Social Connections: Moderately Integrated (12/23/2022)   Received from [facility]   Social Network    How would you rate your social network (family, work, friends)?: Adequate participation with social networks     Family History: The patient's family history includes Colon cancer in his mother; Heart failure (age of onset: 13) in his mother; Hypertension (age of onset: 17) in his sister; Other (age of onset: 63) in his father.  ROS:   Please see the history of present  illness.     All other systems reviewed and are negative.  EKGs/Labs/Other Studies Reviewed:    The following studies were reviewed today:  Ecg today shows NSR rate 55. Normal. I have personally reviewed and interpreted this study.   Recent Labs: No results found for requested labs within last 365 days.  Recent Lipid Panel    Component Value Date/Time   CHOL 146 02/22/2017 0855   TRIG 239 (H) 02/22/2017 0855   HDL 31 (L) 02/22/2017 0855   CHOLHDL 4.7 02/22/2017 0855   LDLCALC 67 02/22/2017 0855   Dated 03/31/19 ; cholesterol 147, triglycerides 286, HDL 30, LDL 71. CMET normal. Dated 04/10/21: cholesterol 132, triglycerides 97, HDL 35, LDL 79. CMET is normal. Dated 06/21/22: cholesterol 146, triglycerides 142, HDL 34, LDL 87. Plts 142K otherwise  CBC, CMET and PSA normal  Physical Exam:    VS:  There were no vitals taken for this visit.    Wt Readings from Last 3 Encounters:  09/08/21 211 lb (95.7 kg)  10/22/19 216 lb 6.4 oz (98.2 kg)  02/26/19 230 lb (104.3 kg)     GEN:  Well nourished, well developed in no acute distress HEENT: Normal NECK: No JVD; No carotid bruits LYMPHATICS: No lymphadenopathy CARDIAC: RRR, no murmurs, rubs, gallops RESPIRATORY:  Clear to auscultation without rales, wheezing or rhonchi  ABDOMEN: Soft, non-tender, non-distended MUSCULOSKELETAL:  No edema; No deformity  SKIN: Warm and dry NEUROLOGIC:  Alert and oriented x 3 PSYCHIATRIC:  Normal affect   ASSESSMENT:    No diagnosis found.   PLAN:    In order of problems listed above:  Hypertension: Blood pressure is well  Controlled. Continue current therapy. Rx refilled.   Hypertriglyceridemia: focus on dietary modification and weight control. Much better. Last triglycerides 97.  Follow up in one year.   Medication Adjustments/Labs and Tests Ordered: Current medicines are reviewed at length with the patient today.  Concerns regarding medicines are outlined above.  No orders of the defined  types were placed in this encounter.  No orders of the defined types were placed in this encounter.   There are no Patient Instructions on file for this visit.   Signed, Devetta Hagenow [place], MD  03/12/2023 7:35 AM     [facility]

## 2023-03-13 ENCOUNTER — Encounter: Payer: Self-pay | Admitting: *Deleted

## 2023-03-13 ENCOUNTER — Ambulatory Visit: Payer: Medicare Other | Admitting: Cardiology

## 2023-03-13 NOTE — Telephone Encounter (Signed)
LMOVM for patient regarding appointment cancellation due to inclement weather. Advised a scheduler will call later today to reschedule. Main office number provided.

## 2023-03-17 NOTE — Progress Notes (Signed)
Cardiology Office Note:    Date:  03/22/2023   ID:  Jeffery Kiel., DOB 1945/11/11, MRN [id number]  PCP:  Avel Peace Chales Salmon, MD  Cardiologist:  Jett Fukuda [place], MD  Electrophysiologist:  None   Referring MD: Eartha Inch, MD   Chief Complaint  Patient presents with   Hypertension   Hyperlipidemia    History of Present Illness:    Jeffery Lalla. is a 78 y.o. male seen for follow up HTN and HLD. Myoview obtained on 06/20/2009 showed normal perfusion.  Last echocardiogram in 06/2009 showed normal EF 55%, mild MR.   On evaluation today he reports that 2 months ago he had an episode of severe chest pain and felt that he was going to pass out. Fortunately this did not last long and symptoms resolved. None since then. Concerned because a number of his friends have passed recently. Did not check his BP. He is still working part time. No edema. No dyspnea.    Past Medical History:  Diagnosis Date   Arthritis    narrowing and disc degeneration of lower back"chroin pain"   Cancer (HCC)    skin cancer   Chronic back pain    chronic back pain- see pain management MD   Diverticulitis    had tiny perforation of bowel- 04/2014   GI bleed 2008   Gout    Hypertension    PONV (postoperative nausea and vomiting)    better with recent surgeries   Swallowing difficulty    can swallow, but often times has to spit up" bring up a lot of foamy tough"   Transfusion history    x2 episodes -post procedure polypectomies at different times under 2 yrs ago    Past Surgical History:  Procedure Laterality Date   APPENDECTOMY     open,"rupture"   BOWEL RESECTION N/A 09/29/2015   Procedure: SMALL BOWEL RESECTION;  Surgeon: Gaynelle Adu, MD;  Location: [facility] OR;  Service: General;  Laterality: N/A;   CERVICAL FUSION  2000   CHOLECYSTECTOMY     CHOLECYSTECTOMY, LAPAROSCOPIC  8/15   COLONOSCOPY W/ BIOPSIES AND POLYPECTOMY     ERCP N/A 06/02/2014   Procedure: ENDOSCOPIC RETROGRADE  CHOLANGIOPANCREATOGRAPHY (ERCP);  Surgeon: Willis Modena, MD;  Location: [facility];  Service: Endoscopy;  Laterality: N/A;   ERCP N/A 06/29/2014   Procedure: ENDOSCOPIC RETROGRADE CHOLANGIOPANCREATOGRAPHY (ERCP);  Surgeon: Willis Modena, MD;  Location: [facility];  Service: Endoscopy;  Laterality: N/A;   ESOPHAGOGASTRODUODENOSCOPY (EGD) WITH PROPOFOL N/A 08/04/2014   Procedure: ESOPHAGOGASTRODUODENOSCOPY (EGD) WITH PROPOFOL;  Surgeon: Willis Modena, MD;  Location: [facility];  Service: Endoscopy;  Laterality: N/A;   EUS N/A 05/26/2014   Procedure: FULL UPPER ENDOSCOPIC ULTRASOUND (EUS) RADIAL;  Surgeon: Willis Modena, MD;  Location: [facility];  Service: Endoscopy;  Laterality: N/A;   EUS N/A 06/29/2014   Procedure: UPPER ENDOSCOPIC ULTRASOUND (EUS) RADIAL;  Surgeon: Willis Modena, MD;  Location: [facility];  Service: Endoscopy;  Laterality: N/A;   HERNIA REPAIR     Bilateral   INCISIONAL HERNIA REPAIR N/A 09/04/2016   Procedure: LAPAROSCOPIC REPAIR INCISIONAL HERNIA WITH MESH;  Surgeon: Gaynelle Adu, MD;  Location: [facility] ORS;  Service: General;  Laterality: N/A;   INSERTION OF MESH N/A 09/04/2016   Procedure: INSERTION OF MESH;  Surgeon: Gaynelle Adu, MD;  Location: [facility];  Service: General;  Laterality: N/A;   LAPAROSCOPY N/A 09/29/2015   Procedure: LAPAROSCOPY DIAGNOSTIC;  Surgeon: Gaynelle Adu, MD;  Location: [facility] OR;  Service: General;  Laterality: N/A;   LAPAROTOMY N/A 09/29/2015   Procedure: EXPLORATORY LAPAROTOMY;  Surgeon: Gaynelle Adu, MD;  Location: [facility] OR;  Service: General;  Laterality: N/A;    Current Medications: Current Meds  Medication Sig   carvedilol (COREG) 12.5 MG tablet TAKE 1 TABLET (12.5 MG TOTAL) BY MOUTH 2 (TWO) TIMES DAILY. NEED OV.   docusate sodium (COLACE) 100 MG capsule Take 100 mg by mouth daily.   losartan (COZAAR) 25 MG tablet TAKE 1 TABLET BY MOUTH DAILY. PLEASE CALL OFFICE TO SCHEDULE AN APPT FOR FURTHER REFILLS. THANK YOU   naproxen sodium (ANAPROX) 220  MG tablet Take 440 mg by mouth daily as needed (for pain.).   oxyCODONE (ROXICODONE) 15 MG immediate release tablet Take 15 mg by mouth every 12 (twelve) hours.   tamsulosin (FLOMAX) 0.4 MG CAPS capsule Take 0.4 mg by mouth daily after supper.   zolpidem (AMBIEN) 10 MG tablet Take 10 mg by mouth at bedtime.     Allergies:   Flagyl [metronidazole], Amlodipine, Penicillins, Allopurinol, Amlodipine besy-benazepril hcl, Ancef [cefazolin], Dexilant [dexlansoprazole], and Vioxx [rofecoxib]   Social History   Socioeconomic History   Marital status: Divorced    Spouse name: Not on file   Number of children: Not on file   Years of education: Not on file   Highest education level: Not on file  Occupational History   Not on file  Tobacco Use   Smoking status: Never   Smokeless tobacco: Never  Vaping Use   Vaping status: Never Used  Substance and Sexual Activity   Alcohol use: No    Alcohol/week: 5.0 standard drinks of alcohol    Types: 5 Standard drinks or equivalent per week    Comment: none in 2 years   Drug use: No   Sexual activity: Not Currently  Other Topics Concern   Not on file  Social History Narrative   Not on file   Social Drivers of Health   Financial Resource Strain: Low Risk  (12/23/2022)   Received from [facility]   Overall Financial Resource Strain (CARDIA)    Difficulty of Paying Living Expenses: Not hard at all  Food Insecurity: No Food Insecurity (12/23/2022)   Received from [facility]   Hunger Vital Sign    Worried About Running Out of Food in the Last Year: Never true    Ran Out of Food in the Last Year: Never true  Transportation Needs: No Transportation Needs (12/23/2022)   Received from [facility] - Transportation    Lack of Transportation (Medical): No    Lack of Transportation (Non-Medical): No  Physical Activity: Insufficiently Active (12/23/2022)   Received from [facility]   Exercise Vital Sign    Days of Exercise per Week: 3  days    Minutes of Exercise per Session: 20 min  Stress: No Stress Concern Present (12/23/2022)   Received from [facility] - Occupational Stress Questionnaire    Feeling of Stress : Not at all  Social Connections: Moderately Integrated (12/23/2022)   Received from [facility]   Social Network    How would you rate your social network (family, work, friends)?: Adequate participation with social networks     Family History: The patient's family history includes Colon cancer in his mother; Heart failure (age of onset: 58) in his mother; Hypertension (age of onset: 20) in his sister; Other (age of onset: 68) in his father.  ROS:   Please  see the history of present illness.     All other systems reviewed and are negative.  EKGs/Labs/Other Studies Reviewed:    The following studies were reviewed today:  EKG Interpretation Date/Time:  Friday March 22 2023 08:07:48 EST Ventricular Rate:  54 PR Interval:  206 QRS Duration:  142 QT Interval:  448 QTC Calculation: 424 R Axis:   48  Text Interpretation: Sinus bradycardia Right bundle branch block When compared with ECG of September 08, 2021 Right bundle branch block is now Present Confirmed by [place], Lea Baine [phone]) on 03/22/2023 8:09:31 AM     Recent Labs: No results found for requested labs within last 365 days.  Recent Lipid Panel    Component Value Date/Time   CHOL 146 02/22/2017 0855   TRIG 239 (H) 02/22/2017 0855   HDL 31 (L) 02/22/2017 0855   CHOLHDL 4.7 02/22/2017 0855   LDLCALC 67 02/22/2017 0855   Dated 03/31/19 ; cholesterol 147, triglycerides 286, HDL 30, LDL 71. CMET normal. Dated 04/10/21: cholesterol 132, triglycerides 97, HDL 35, LDL 79. CMET is normal. Dated 06/21/22: cholesterol 146, triglycerides 142, HDL 34, LDL 87. Plts 142K otherwise CBC, CMET and PSA normal  Physical Exam:    VS:  BP 136/74 (BP Location: Left Arm, Patient Position: Sitting, Cuff Size: Normal)   Pulse  (!) 54   Ht 6' 2.5" (1.892 m)   Wt 216 lb 9.6 oz (98.2 kg)   SpO2 95%   BMI 27.44 kg/m     Wt Readings from Last 3 Encounters:  03/22/23 216 lb 9.6 oz (98.2 kg)  09/08/21 211 lb (95.7 kg)  10/22/19 216 lb 6.4 oz (98.2 kg)     GEN:  Well nourished, well developed in no acute distress HEENT: Normal NECK: No JVD; No carotid bruits CARDIAC: RRR, no murmurs, rubs, gallops RESPIRATORY:  Clear to auscultation without rales, wheezing or rhonchi  ABDOMEN: Soft, non-tender, non-distended MUSCULOSKELETAL:  No edema, pulses 2+ SKIN: Warm and dry NEUROLOGIC:  Alert and oriented x 3 PSYCHIATRIC:  Normal affect   ASSESSMENT:    1. Precordial pain   2. Essential hypertension   3. Pure hypercholesterolemia   4. Near syncope   5. RBBB      PLAN:    In order of problems listed above:  Chest pain. Risk factors of HTN and HLD. New RBBB on Ecg. Recommend ischemic evaluation. Will arrange for coronary CTA HTN. Borderline elevated. On losartan and Coreg. Asked him to keep a diary so we can review HLD. Last LDL 87. If he does have coronary plaque will need to go on statin therapy New RBBB. No AV block noted.   Follow up post CTA   Medication Adjustments/Labs and Tests Ordered: Current medicines are reviewed at length with the patient today.  Concerns regarding medicines are outlined above.  Orders Placed This Encounter  Procedures   CT CORONARY MORPH W/CTA COR W/SCORE W/CA W/CM &/OR WO/CM   Comprehensive Metabolic Panel (CMET)   Lipid panel   EKG 12-Lead   No orders of the defined types were placed in this encounter.   There are no Patient Instructions on file for this visit.   Signed, Jennalynn Rivard [place], MD  03/22/2023 8:24 AM    [facility]

## 2023-03-22 ENCOUNTER — Ambulatory Visit: Payer: Medicare Other | Attending: Cardiology | Admitting: Cardiology

## 2023-03-22 ENCOUNTER — Encounter: Payer: Self-pay | Admitting: Cardiology

## 2023-03-22 VITALS — BP 136/74 | HR 54 | Ht 74.5 in | Wt 216.6 lb

## 2023-03-22 DIAGNOSIS — I10 Essential (primary) hypertension: Secondary | ICD-10-CM

## 2023-03-22 DIAGNOSIS — R072 Precordial pain: Secondary | ICD-10-CM

## 2023-03-22 DIAGNOSIS — I4510 Unspecified right bundle-branch block: Secondary | ICD-10-CM

## 2023-03-22 DIAGNOSIS — R55 Syncope and collapse: Secondary | ICD-10-CM

## 2023-03-22 DIAGNOSIS — E7800 Pure hypercholesterolemia, unspecified: Secondary | ICD-10-CM

## 2023-03-22 LAB — COMPREHENSIVE METABOLIC PANEL
ALT: 15 [IU]/L (ref 0–44)
AST: 18 [IU]/L (ref 0–40)
Albumin: 4.3 g/dL (ref 3.8–4.8)
Alkaline Phosphatase: 96 [IU]/L (ref 44–121)
BUN/Creatinine Ratio: 15 (ref 10–24)
BUN: 12 mg/dL (ref 8–27)
Bilirubin Total: 0.3 mg/dL (ref 0.0–1.2)
CO2: 25 mmol/L (ref 20–29)
Calcium: 9.3 mg/dL (ref 8.6–10.2)
Chloride: 101 mmol/L (ref 96–106)
Creatinine, Ser: 0.81 mg/dL (ref 0.76–1.27)
Globulin, Total: 2.3 g/dL (ref 1.5–4.5)
Glucose: 88 mg/dL (ref 70–99)
Potassium: 5 mmol/L (ref 3.5–5.2)
Sodium: 140 mmol/L (ref 134–144)
Total Protein: 6.6 g/dL (ref 6.0–8.5)
eGFR: 91 mL/min/{1.73_m2} (ref >59)

## 2023-03-22 LAB — LIPID PANEL
Chol/HDL Ratio: 4.4 {ratio} (ref 0.0–5.0)
Cholesterol, Total: 140 mg/dL (ref 100–199)
HDL: 32 mg/dL — ABNORMAL LOW (ref >39)
LDL Chol Calc (NIH): 77 mg/dL (ref 0–99)
Triglycerides: 181 mg/dL — ABNORMAL HIGH (ref 0–149)
VLDL Cholesterol Cal: 31 mg/dL (ref 5–40)

## 2023-03-22 NOTE — Patient Instructions (Addendum)
Medication Instructions:  Continue same medications *If you need a refill on your cardiac medications before your next appointment, please call your pharmacy*   Lab Work: Cmet,lipid panel today   Testing/Procedures: Coronary CT  will be scheduled after approved by insurance Follow directions below   Follow-Up: At [facility], you and your health needs are our priority.  As part of our continuing mission to provide you with exceptional heart care, we have created designated Provider Care Teams.  These Care Teams include your primary Cardiologist (physician) and Advanced Practice Providers (APPs -  Physician Assistants and Nurse Practitioners) who all work together to provide you with the care you need, when you need it.  We recommend signing up for the patient portal called "MyChart".  Sign up information is provided on this After Visit Summary.  MyChart is used to connect with patients for Virtual Visits (Telemedicine).  Patients are able to view lab/test results, encounter notes, upcoming appointments, etc.  Non-urgent messages can be sent to your provider as well.   To learn more about what you can do with MyChart, go to [email or web address].    Your next appointment:  After test    Provider:  Dr.Jordan        Check blood pressure daily keep a log and bring to next appointment         Your cardiac CT will be scheduled at one of the below locations:   [facility] [address] [place], [place] [address] [phone]  OR  [facility] [address] [place], [place] [address] [phone]  OR   [facility] [address] [place], [place] [address] [phone]  OR   [facility] [address] [place], [place] [address] [phone]  If scheduled at [facility], please arrive at the [facility] and Children's Entrance (Entrance C2) of [facility] 30 minutes prior to test start time. You can use the FREE valet parking offered at entrance C (encouraged to control the heart rate for the test)  Proceed to the [facility] Radiology Department (first floor) to check-in and test prep.  All radiology patients and guests should use entrance C2 at [facility], accessed from [facility], even though the hospital's physical address listed is [address]    If scheduled at [facility] or [facility], please arrive 15 mins early for check-in and test prep.  There is spacious parking and easy access to the radiology department from the [facility] entrance. Please enter here and check-in with the desk attendant.   If scheduled at [facility], please arrive 30 minutes early for check-in and test prep.  Please follow these instructions carefully (unless otherwise directed):  An IV will be required for this test and Nitroglycerin will be given.  Hold all erectile dysfunction medications at least 3 days (72 hrs) prior to test. (Ie viagra, cialis, sildenafil, tadalafil, etc)   On the Night Before the Test: Be sure to Drink plenty of water. Do not consume any caffeinated/decaffeinated beverages or chocolate 12 hours prior to your test. Do not take any antihistamines 12 hours prior to your test.   On the Day of the Test: Drink plenty of water until 1 hour prior to the test. Do not eat any food 1 hour prior to test. You may take your regular medications prior to the  test.  Take your Coreg 12.5 mg  two hours prior to test. If you take Furosemide/Hydrochlorothiazide/Spironolactone/Chlorthalidone, please HOLD on the morning of the test.        After the Test: Drink plenty of water. After receiving IV contrast, you may experience a mild flushed feeling. This is normal. On occasion, you may experience a mild rash up to 24 hours after the  test. This is not dangerous. If this occurs, you can take Benadryl 25 mg, Zyrtec, Claritin, or Allegra and increase your fluid intake. (Patients taking Tikosyn should avoid Benadryl, and may take Zyrtec, Claritin, or Allegra) If you experience trouble breathing, this can be serious. If it is severe call 911 IMMEDIATELY. If it is mild, please call our office.  We will call to schedule your test 2-4 weeks out understanding that some insurance companies will need an authorization prior to the service being performed.   For more information and frequently asked questions, please visit our website : [email or web address]  For non-scheduling related questions, please contact the cardiac imaging nurse navigator should you have any questions/concerns: Cardiac Imaging Nurse Navigators Direct Office Dial: [phone]   For scheduling needs, including cancellations and rescheduling, please call [place], [phone].

## 2023-03-27 ENCOUNTER — Encounter (HOSPITAL_COMMUNITY): Payer: Self-pay

## 2023-03-28 ENCOUNTER — Ambulatory Visit (HOSPITAL_COMMUNITY)
Admission: RE | Admit: 2023-03-28 | Discharge: 2023-03-28 | Disposition: A | Discharge status: Home / Self Care | Payer: Medicare Other | Source: Ambulatory Visit | Attending: Cardiology | Admitting: Cardiology

## 2023-03-28 DIAGNOSIS — E7800 Pure hypercholesterolemia, unspecified: Secondary | ICD-10-CM | POA: Diagnosis present

## 2023-03-28 DIAGNOSIS — R931 Abnormal findings on diagnostic imaging of heart and coronary circulation: Secondary | ICD-10-CM | POA: Insufficient documentation

## 2023-03-28 DIAGNOSIS — R55 Syncope and collapse: Secondary | ICD-10-CM | POA: Diagnosis present

## 2023-03-28 DIAGNOSIS — I2510 Atherosclerotic heart disease of native coronary artery without angina pectoris: Secondary | ICD-10-CM

## 2023-03-28 DIAGNOSIS — I4510 Unspecified right bundle-branch block: Secondary | ICD-10-CM | POA: Diagnosis present

## 2023-03-28 DIAGNOSIS — R072 Precordial pain: Secondary | ICD-10-CM | POA: Diagnosis present

## 2023-03-28 DIAGNOSIS — I10 Essential (primary) hypertension: Secondary | ICD-10-CM | POA: Insufficient documentation

## 2023-03-28 MED ORDER — METOPROLOL TARTRATE 5 MG/5ML IV SOLN: 10.0000 mg | Freq: Once | INTRAVENOUS | Status: DC | PRN

## 2023-03-28 MED ORDER — IOHEXOL 350 MG/ML SOLN
95.0000 mL | Freq: Once | INTRAVENOUS | Status: AC | PRN
  Administered 2023-03-28: 95 mL via INTRAVENOUS

## 2023-03-28 MED ORDER — NITROGLYCERIN 0.4 MG SL SUBL
SUBLINGUAL_TABLET | SUBLINGUAL | Status: AC
  Filled 2023-03-28: qty 2

## 2023-03-28 MED ORDER — NITROGLYCERIN 0.4 MG SL SUBL
0.8000 mg | SUBLINGUAL_TABLET | Freq: Once | SUBLINGUAL | Status: AC
  Administered 2023-03-28: 0.8 mg via SUBLINGUAL

## 2023-03-29 ENCOUNTER — Other Ambulatory Visit: Payer: Self-pay | Admitting: Cardiology

## 2023-03-29 ENCOUNTER — Ambulatory Visit (HOSPITAL_BASED_OUTPATIENT_CLINIC_OR_DEPARTMENT_OTHER)
Admission: RE | Admit: 2023-03-29 | Discharge: 2023-03-29 | Disposition: A | Discharge status: Home / Self Care | Payer: Medicare Other | Source: Ambulatory Visit | Attending: Cardiology | Admitting: Cardiology

## 2023-03-29 DIAGNOSIS — R931 Abnormal findings on diagnostic imaging of heart and coronary circulation: Secondary | ICD-10-CM

## 2023-04-01 ENCOUNTER — Ambulatory Visit: Payer: Medicare Other | Attending: Cardiology | Admitting: Cardiology

## 2023-04-01 ENCOUNTER — Encounter: Payer: Self-pay | Admitting: Cardiology

## 2023-04-01 VITALS — BP 128/70 | HR 55 | Ht 74.0 in | Wt 214.8 lb

## 2023-04-01 DIAGNOSIS — I10 Essential (primary) hypertension: Secondary | ICD-10-CM

## 2023-04-01 DIAGNOSIS — I25118 Atherosclerotic heart disease of native coronary artery with other forms of angina pectoris: Secondary | ICD-10-CM | POA: Diagnosis not present

## 2023-04-01 DIAGNOSIS — I4510 Unspecified right bundle-branch block: Secondary | ICD-10-CM | POA: Diagnosis not present

## 2023-04-01 DIAGNOSIS — E7800 Pure hypercholesterolemia, unspecified: Secondary | ICD-10-CM

## 2023-04-01 MED ORDER — ROSUVASTATIN CALCIUM 10 MG PO TABS: 10.0000 mg | ORAL_TABLET | Freq: Every day | ORAL | 3 refills | Status: DC

## 2023-04-01 MED ORDER — ASPIRIN 81 MG PO TBEC: 81.0000 mg | DELAYED_RELEASE_TABLET | Freq: Every day | ORAL | Status: AC

## 2023-04-01 MED ORDER — LOSARTAN POTASSIUM 50 MG PO TABS: 50.0000 mg | ORAL_TABLET | Freq: Every day | ORAL | 3 refills | Status: DC

## 2023-04-01 NOTE — Patient Instructions (Signed)
 Medication Instructions:  Increase Losartan  to 50 mg daily Start Crestor  10 mg daily Start Aspirin  81 mg daily Continue all other medications *If you need a refill on your cardiac medications before your next appointment, please call your pharmacy*   Lab Work: Bmet,lipid and hepatic panels in 3 months   Testing/Procedures: None ordered   Follow-Up: At [facility], you and your health needs are our priority.  As part of our continuing mission to provide you with exceptional heart care, we have created designated Provider Care Teams.  These Care Teams include your primary Cardiologist (physician) and Advanced Practice Providers (APPs -  Physician Assistants and Nurse Practitioners) who all work together to provide you with the care you need, when you need it.  We recommend signing up for the patient portal called "MyChart".  Sign up information is provided on this After Visit Summary.  MyChart is used to connect with patients for Virtual Visits (Telemedicine).  Patients are able to view lab/test results, encounter notes, upcoming appointments, etc.  Non-urgent messages can be sent to your provider as well.   To learn more about what you can do with MyChart, go to [email or web address].    Your next appointment:  6 months   Call in April to schedule August appointment     Provider:  Dr.Jordan

## 2023-04-01 NOTE — Progress Notes (Signed)
 Cardiology Office Note:    Date:  04/01/2023   ID:  Jeffery Jungwirth., DOB 1945/07/29, MRN [id number]  PCP:  Alfonso Angles Conway Dennis, MD  Cardiologist:  Shanette Tamargo Swazil[place], MD  Electrophysiologist:  None   Referring MD: Alfonso Angles Conway Dennis, MD   Chief Complaint  Patient presents with   Coronary Artery Disease   Hypertension   Hyperlipidemia    History of Present Illness:    Jeffery Krengel. is a 78 y.o. male seen for follow up HTN and HLD. Myoview  obtained on 06/20/2009 showed normal perfusion.  Last echocardiogram in 06/2009 showed normal EF 55%, mild MR.   He was seen earlier - he had an episode of severe chest pain and felt that he was going to pass out. Fortunately this did not last long and symptoms resolved. We performed coronary CTA with results noted below.    Past Medical History:  Diagnosis Date   Arthritis    narrowing and disc degeneration of lower back"chroin pain"   Cancer (HCC)    skin cancer   Chronic back pain    chronic back pain- see pain management MD   Diverticulitis    had tiny perforation of bowel- 04/2014   GI bleed 2008   Gout    Hypertension    PONV (postoperative nausea and vomiting)    better with recent surgeries   Swallowing difficulty    can swallow, but often times has to spit up" bring up a lot of foamy tough"   Transfusion history    x2 episodes -post procedure polypectomies at different times under 2 yrs ago    Past Surgical History:  Procedure Laterality Date   APPENDECTOMY     open,"rupture"   BOWEL RESECTION N/A 09/29/2015   Procedure: SMALL BOWEL RESECTION;  Surgeon: Aldean Hummingbird, MD;  [facility] OR;  Service: General;  Laterality: N/A;   CERVICAL FUSION  2000   CHOLECYSTECTOMY     CHOLECYSTECTOMY, LAPAROSCOPIC  8/15   COLONOSCOPY W/ BIOPSIES AND POLYPECTOMY     ERCP N/A 06/02/2014   Procedure: ENDOSCOPIC RETROGRADE CHOLANGIOPANCREATOGRAPHY (ERCP);  Surgeon: Evangeline Hilts, MD;  Location: [facility]COPY;  Service: Endoscopy;  Laterality:  N/A;   ERCP N/A 06/29/2014   Procedure: ENDOSCOPIC RETROGRADE CHOLANGIOPANCREATOGRAPHY (ERCP);  Surgeon: Evangeline Hilts, MD;  Location: [facility]OSCOPY;  Service: Endoscopy;  Laterality: N/A;   ESOPHAGOGASTRODUODENOSCOPY (EGD) WITH PROPOFOL  N/A 08/04/2014   Procedure: ESOPHAGOGASTRODUODENOSCOPY (EGD) WITH PROPOFOL ;  Surgeon: Evangeline Hilts, MD;  Location: [facility] ENDOSCOPY;  Service: Endoscopy;  Laterality: N/A;   EUS N/A 05/26/2014   Procedure: FULL UPPER ENDOSCOPIC ULTRASOUND (EUS) RADIAL;  Surgeon: Evangeline Hilts, MD;  Location: [facility] ENDOSCOPY;  Service: Endoscopy;  Laterality: N/A;   EUS N/A 06/29/2014   Procedure: UPPER ENDOSCOPIC ULTRASOUND (EUS) RADIAL;  Surgeon: Evangeline Hilts, MD;  Location: [facility] ENDOSCOPY;  Service: Endoscopy;  Laterality: N/A;   HERNIA REPAIR     Bilateral   INCISIONAL HERNIA REPAIR N/A 09/04/2016   Procedure: LAPAROSCOPIC REPAIR INCISIONAL HERNIA WITH MESH;  Surgeon: Aldean Hummingbird, MD;  Location: [facility] ORS;  Service: General;  Laterality: N/A;   INSERTION OF MESH N/A 09/04/2016   Procedure: INSERTION OF MESH;  Surgeon: Aldean Hummingbird, MD;  Location: [facility]S;  Service: General;  Laterality: N/A;   LAPAROSCOPY N/A 09/29/2015   Procedure: LAPAROSCOPY DIAGNOSTIC;  Surgeon: Aldean Hummingbird, MD;  Location: [facility] OR;  Service: General;  Laterality: N/A;   LAPAROTOMY N/A 09/29/2015   Procedure: EXPLORATORY LAPAROTOMY;  Surgeon: Aldean Hummingbird, MD;  Location: [facility] OR;  Service: General;  Laterality: N/A;    Current Medications: Current Meds  Medication Sig   aspirin  EC 81 MG tablet Take 1 tablet (81 mg total) by mouth daily. Swallow whole.   carvedilol  (COREG ) 12.5 MG tablet TAKE 1 TABLET (12.5 MG TOTAL) BY MOUTH 2 (TWO) TIMES DAILY. NEED OV.   docusate sodium  (COLACE) 100 MG capsule Take 100 mg by mouth daily.   losartan  (COZAAR ) 50 MG tablet Take 1 tablet (50 mg total) by mouth daily.   naproxen  sodium (ANAPROX ) 220 MG tablet Take 440 mg by mouth daily as needed (for pain.).   oxyCODONE  (ROXICODONE ) 15 MG  immediate release tablet Take 20 mg by mouth every 12 (twelve) hours.   rosuvastatin  (CRESTOR ) 10 MG tablet Take 1 tablet (10 mg total) by mouth daily.   tamsulosin  (FLOMAX ) 0.4 MG CAPS capsule Take 0.4 mg by mouth daily after supper.   zolpidem  (AMBIEN ) 10 MG tablet Take 10 mg by mouth at bedtime.   [DISCONTINUED] losartan  (COZAAR ) 25 MG tablet TAKE 1 TABLET BY MOUTH DAILY. PLEASE CALL OFFICE TO SCHEDULE AN APPT FOR FURTHER REFILLS. THANK YOU     Allergies:   Flagyl [metronidazole], Amlodipine, Penicillins, Allopurinol, Amlodipine besy-benazepril hcl, Ancef [cefazolin], Dexilant [dexlansoprazole], and Vioxx [rofecoxib]   Social History   Socioeconomic History   Marital status: Divorced    Spouse name: Not on file   Number of children: Not on file   Years of education: Not on file   Highest education level: Not on file  Occupational History   Not on file  Tobacco Use   Smoking status: Never   Smokeless tobacco: Never  Vaping Use   Vaping status: Never Used  Substance and Sexual Activity   Alcohol use: No    Alcohol/week: 5.0 standard drinks of alcohol    Types: 5 Standard drinks or equivalent per week    Comment: none in 2 years   Drug use: No   Sexual activity: Not Currently  Other Topics Concern   Not on file  Social History Narrative   Not on file   Social Drivers of Health   Financial Resource Strain: Low Risk  (03/25/2023)   Received from [facility]   Overall Financial Resource Strain (CARDIA)    Difficulty of Paying Living Expenses: Not hard at all  Food Insecurity: No Food Insecurity (03/25/2023)   Received from [facility]   Hunger Vital Sign    Worried About Running Out of Food in the Last Year: Never true    Ran Out of Food in the Last Year: Never true  Transportation Needs: No Transportation Needs (03/25/2023)   Received from [facility] - Transportation    Lack of Transportation (Medical): No    Lack of Transportation (Non-Medical): No   Physical Activity: Insufficiently Active (12/23/2022)   Received from [facility]   Exercise Vital Sign    Days of Exercise per Week: 3 days    Minutes of Exercise per Session: 20 min  Stress: No Stress Concern Present (12/23/2022)   Received from [facility] - Occupational Stress Questionnaire    Feeling of Stress : Not at all  Social Connections: Moderately Integrated (12/23/2022)   Received from [facility]   Social Network    How would you rate your social network (family, work, friends)?: Adequate participation with social networks     Family History: The patient's family history includes Colon cancer  in his mother; Heart failure (age of onset: 60) in his mother; Hypertension (age of onset: 31) in his sister; Other (age of onset: 22) in his father.  ROS:   Please see the history of present illness.     All other systems reviewed and are negative.  EKGs/Labs/Other Studies Reviewed:    The following studies were reviewed today: Coronary CTA: 03/28/23: FINDINGS: Coronary calcium  score: The patient's coronary artery calcium  score is 1919, which places the patient in the 87th percentile.   Coronary arteries: Normal coronary origins.  Right dominance.   Right Coronary Artery: Normal caliber vessel, gives rise to PDA. Diffuse mixed calcified and noncalcified plaque throughout entire vessel. Maximal stenosis 25-49% in proximal vessel, 50-69% in mid vessel, and at least 50-69% stenosis in distal vessel.   Left Main Coronary Artery: Normal caliber vessel. Diffuse mixed calcified and noncalcified plaque with maximum 25-49% stenosis in mid vessel. Two small ramus branches, first is small caliber/short course, second is small caliber but longer course.   Left Anterior Descending Coronary Artery: Normal caliber vessel. Diffuse mixed calcified and noncalcified plaque throughout proximal and mid vessel. Maximum 50-69% stenosis in proximal  vessel and 50-69% stenosis in mid vessel. Gives rise to two medium sized diagonal branches.   Left Circumflex Artery: Normal caliber vessel. Focal mixed calcified and noncalcified plaque in mid vessel with 1-24% stenosis. Gives rise to one OM branch.   Aorta: Mildly enlarged size, 41 mm at the mid ascending aorta (level of the PA bifurcation) measured double oblique. Aortic atherosclerosis. No dissection seen in visualized portions of the aorta.   Aortic Valve: No significant calcifications. Trileaflet.   Other findings:   Normal pulmonary vein drainage into the left atrium.   Normal left atrial appendage without a thrombus.   Normal size of the pulmonary artery.   Normal appearance of the pericardium.   IMPRESSION: 1. Diffuse CAD with at least moderate stenosis in RCA and LAD, CADRADS = 3. Diffuse disease in all three vessels with stenoses as noted above. CT FFR will be performed and reported separately.   2. Coronary calcium  score of 1919. This was 87th percentile for age-, sex-, and race- matched controls.   3. Total plaque volume 1724 mm3 which is 89th percentile for age- and sex- matched controls (calcified plaque 538 mm3; noncalcified plaque 1186 mm3). Total plaque volume is extensive.   4. Normal coronary origin with right dominance.   5.  Mildly enlarged ascending aorta, 41 mm.   INTERPRETATION:   CAD-RADS 3: Moderate stenosis (50-69%). Consider symptom-guided anti-ischemic pharmacotherapy as well as risk factor modification per guideline directed care. Additional analysis with CT FFR will be submitted.     Electronically Signed   By: Sheryle Donning M.D.   On: 03/29/2023 12:19    FFR: 1. Left Main: No significant stenosis. FFR = 0.99. First ramus too small to evaluate, second ramus with gradual decrease in FFR to 0.80 at distal portion.   2. LAD: No significant focal stenosis, but gradual decrease in FFR across vessel. Proximal FFR = 0.97, Mid  FFR = 0.80, Distal FFR = 0.76 3. LCX: No significant stenosis. Proximal FFR = 0.97, Distal FFR = 0.92 4. RCA: No significant focal stenosis, but gradual decrease in FFR across vessel. Proximal FFR = 0.97, Mid FFR = 0.82, Distal FFR = 0.73   IMPRESSION: 1. CT FFR analysis did not show any significant focal stenosis, but there is gradual taper/decreased flow most prominently in the RCA and LAD.  Recent Labs: 03/22/2023: ALT 15; BUN 12; Creatinine, Ser 0.81; Potassium 5.0; Sodium 140  Recent Lipid Panel    Component Value Date/Time   CHOL 140 03/22/2023 0858   TRIG 181 (H) 03/22/2023 0858   HDL 32 (L) 03/22/2023 0858   CHOLHDL 4.4 03/22/2023 0858   LDLCALC 77 03/22/2023 0858   Dated 03/31/19 ; cholesterol 147, triglycerides 286, HDL 30, LDL 71. CMET normal. Dated 04/10/21: cholesterol 132, triglycerides 97, HDL 35, LDL 79. CMET is normal. Dated 06/21/22: cholesterol 146, triglycerides 142, HDL 34, LDL 87. Plts 142K otherwise CBC, CMET and PSA normal  Physical Exam:    VS:  BP 128/70   Pulse (!) 55   Ht 6\' 2"  (1.88 m)   Wt 214 lb 12.8 oz (97.4 kg)   SpO2 93%   BMI 27.58 kg/m     Wt Readings from Last 3 Encounters:  04/01/23 214 lb 12.8 oz (97.4 kg)  03/22/23 216 lb 9.6 oz (98.2 kg)  09/08/21 211 lb (95.7 kg)     GEN:  Well nourished, well developed in no acute distress HEENT: Normal NECK: No JVD; No carotid bruits CARDIAC: RRR, no murmurs, rubs, gallops RESPIRATORY:  Clear to auscultation without rales, wheezing or rhonchi  ABDOMEN: Soft, non-tender, non-distended MUSCULOSKELETAL:  No edema, pulses 2+ SKIN: Warm and dry NEUROLOGIC:  Alert and oriented x 3 PSYCHIATRIC:  Normal affect   ASSESSMENT:    1. Coronary artery disease of native artery of native heart with stable angina pectoris (HCC)   2. Pure hypercholesterolemia   3. Essential hypertension   4. RBBB       PLAN:    In order of problems listed above:  CAD - coronary CTA appears to show  significant plaque volume but no severe blockage. He has not had any further chest pain. Risk factors of HTN and HLD. New RBBB on Ecg. At this point unless symptoms of chest pain recur would focus on optimal risk factor modification. Add ASA 81 mg daily.  HTN. BP still running high at home. Will increase losartan  to 50 mg daily. Apparently had swelling on amlodipine in the past.  HLD. Last LDL 87. Would like to see LDL < 55. Add Crestor  10 mg daily. Repeat fasting lab in 3 months.  New RBBB. No AV block noted.   Follow up 6 months.   Medication Adjustments/Labs and Tests Ordered: Current medicines are reviewed at length with the patient today.  Concerns regarding medicines are outlined above.  Orders Placed This Encounter  Procedures   Basic metabolic panel   Lipid panel   Hepatic function panel   Meds ordered this encounter  Medications   rosuvastatin  (CRESTOR ) 10 MG tablet    Sig: Take 1 tablet (10 mg total) by mouth daily.    Dispense:  90 tablet    Refill:  3   losartan  (COZAAR ) 50 MG tablet    Sig: Take 1 tablet (50 mg total) by mouth daily.    Dispense:  90 tablet    Refill:  3   aspirin  EC 81 MG tablet    Sig: Take 1 tablet (81 mg total) by mouth daily. Swallow whole.      Signed, Chadley Dziedzic Swaz[place], MD  04/01/2023 12:17 PM    [facility]

## 2023-04-12 ENCOUNTER — Telehealth: Payer: Self-pay | Admitting: Cardiology

## 2023-04-12 DIAGNOSIS — E781 Pure hyperglyceridemia: Secondary | ICD-10-CM

## 2023-04-12 DIAGNOSIS — E7800 Pure hypercholesterolemia, unspecified: Secondary | ICD-10-CM

## 2023-04-12 MED ORDER — COQ10 30 MG PO CAPS: ORAL_CAPSULE | ORAL | Status: DC

## 2023-04-12 NOTE — Telephone Encounter (Signed)
Spoke to patient Dr.Jordan's advice given.He will decrease Rosuvastatin to 5 mg every other day.He will start taking CoQ10 30 mg daily. Scheduler will call back with PharmD lipid clinic appointment.

## 2023-04-12 NOTE — Telephone Encounter (Signed)
Called and spoke to pt who states he started Rosuvastatin 10 days ago (after OV w/[name]).  He has then developed Muscle Soreness, mostly in his legs, hips, and lower back.  He states this is bad timing for starting the statin as he is also having some problems with his back and it has been difficult to differentiate between the statin effects and the back pains.  He also has developed occasional cramps, and a headache (that has resolved).  He took Aleve once yesterday, and feels like muscle soreness and cramps are somewhat better today. He would like to know if he can take OTC Coenzyme Q10 ?  Asking if symptoms will improve eventually?  What can be done?  He also would like a referral to Nutritionist/dietician to help him develop a plan to improve/change his eating habits.

## 2023-04-12 NOTE — Telephone Encounter (Signed)
Pt c/o medication issue:  1. Name of Medication:  rosuvastatin (CRESTOR) 10 MG tablet  Coq10  2. How are you currently taking this medication (dosage and times per day)? Takes rosuvastatin 1 tablet daily  3. Are you having a reaction (difficulty breathing--STAT)? no  4. What is your medication issue? Patient states he has been having muscle soreness from the rosuvastatin, but it is subsiding. He would like to know if he can take Coq10 to help with some of the muscle soreness. He also wants to know if there is a dietitian she could recommend.

## 2023-04-16 ENCOUNTER — Ambulatory Visit: Payer: Medicare Other | Admitting: Cardiology

## 2023-05-07 ENCOUNTER — Telehealth: Payer: Self-pay | Admitting: Cardiology

## 2023-05-07 DIAGNOSIS — E7800 Pure hypercholesterolemia, unspecified: Secondary | ICD-10-CM

## 2023-05-07 DIAGNOSIS — I25118 Atherosclerotic heart disease of native coronary artery with other forms of angina pectoris: Secondary | ICD-10-CM

## 2023-05-07 NOTE — Telephone Encounter (Signed)
 Called and spoke to pt c/o intolerance to Rosuvastatin.  It was prescribed about one month ago after OV with [name].  The dosage was cut back to 5 mg every other Day (has been taking this dose for last 2 weeks).  He began with muscle pain, all over, but he states the pain is now in the joints.  The right knee is warm to the touch and swollen, when compared to the left knee.  No redness.  Pt states it is in the joint.  The symptoms have become worse since decreasing the dose to 5 mg every other day.  He can not take this medication and will stop taking it; he has tried to tolerate it but not able to.  Also states pain is in his shoulders (joints).  He also states he is not sure if he can keep the 05/30/23 appointment with PharmD.

## 2023-05-07 NOTE — Telephone Encounter (Signed)
 Pt c/o medication issue:  1. Name of Medication: rosuvastatin (CRESTOR) 10 MG tablet   2. How are you currently taking this medication (dosage and times per day)? Taking 5mg  qod  3. Are you having a reaction (difficulty breathing--STAT)?   4. What is your medication issue? Pt is still having muscle aches along with joint pain even after decreasing the medication. Requesting cb

## 2023-05-08 NOTE — Telephone Encounter (Signed)
 Called patient left Dr.Jordan's advice on personal voice mail.Advised to stop Crestor.Scheduler will call back with PharmD lipid clinic appointment.Advised to see PCP or Orthopedic if knee red or swollen.Advised to call back if he has any questions.

## 2023-05-30 ENCOUNTER — Ambulatory Visit: Payer: Medicare Other | Attending: Cardiovascular Disease | Admitting: Pharmacist

## 2023-05-30 ENCOUNTER — Telehealth: Payer: Self-pay | Admitting: Pharmacist

## 2023-05-30 ENCOUNTER — Other Ambulatory Visit (HOSPITAL_COMMUNITY): Payer: Self-pay

## 2023-05-30 ENCOUNTER — Encounter: Payer: Self-pay | Admitting: Pharmacist

## 2023-05-30 ENCOUNTER — Telehealth: Payer: Self-pay | Admitting: Pharmacy Technician

## 2023-05-30 DIAGNOSIS — E781 Pure hyperglyceridemia: Secondary | ICD-10-CM | POA: Diagnosis not present

## 2023-05-30 DIAGNOSIS — I25118 Atherosclerotic heart disease of native coronary artery with other forms of angina pectoris: Secondary | ICD-10-CM

## 2023-05-30 DIAGNOSIS — E7800 Pure hypercholesterolemia, unspecified: Secondary | ICD-10-CM | POA: Diagnosis not present

## 2023-05-30 DIAGNOSIS — R931 Abnormal findings on diagnostic imaging of heart and coronary circulation: Secondary | ICD-10-CM

## 2023-05-30 MED ORDER — REPATHA SURECLICK 140 MG/ML ~~LOC~~ SOAJ: 1.0000 mL | SUBCUTANEOUS | 5 refills | Status: DC

## 2023-05-30 NOTE — Patient Instructions (Signed)
 It was nice meeting you two today  We would like your LDL (bad cholesterol) to be less than 55  The medication we discussed today is called Repatha which is an injection you would take once every 2 weeks  I will complete the prior authorization for you and contact you when it is approved  Once you start the medication we will recheck your fasting lipid panel in 2-3 months  Please let us know if you have any questions or concerns  Laural Golden, PharmD, BCACP, CDCES, CPP [address], [address] [place], [place], [address] Phone: [phone], Fax: [phone]

## 2023-05-30 NOTE — Progress Notes (Signed)
 Patient ID: Jeffery Harvey.                 DOB: 1945/06/11                    MRN: [id number]     HPI: Jeffery Harvey. is a 78 y.o. male patient referred to lipid clinic by Dr Swaziland. PMH is significant for CAD, elevated coronary calcium score, HTN, osteoarthritis, and statin intolerance.  Patient presents today with wife. Unable to tolerate rosuvastatin. Initial dosage caused muscle and joint pains. Dosage was reduced and patient was advised to take every other day however symptoms persisted. Discontinued rosuvastatin and symptoms resoved quickly.  Wife also has elevated LDL and follows with lipid clinic. She has been preparing heart healthy foods. Last night they ate salmon and asparagus.   Reports occasional chest pains occurring 2-4 times a year.  Current Medications: N/A  Intolerances:  Rosuvastatin Statins  Risk Factors:  CAD HLD Elevated coronary calcium score  LDL goal: <55  Labs: TC 140, Trigs 181, HDL 32, LDL 77 (03/22/23)  Imaging: IMPRESSION: 1. Diffuse CAD with at least moderate stenosis in RCA and LAD, CADRADS = 3. Diffuse disease in all three vessels with stenoses as noted above. CT FFR will be performed and reported separately.   2. Coronary calcium score of 1919. This was 87th percentile for age-, sex-, and race- matched controls.   3. Total plaque volume 1724 mm3 which is 89th percentile for age- and sex- matched controls (calcified plaque 538 mm3; noncalcified plaque 1186 mm3). Total plaque volume is extensive.   4. Normal coronary origin with right dominance.   5.  Mildly enlarged ascending aorta, 41 mm.  Past Medical History:  Diagnosis Date   Arthritis    narrowing and disc degeneration of lower back"chroin pain"   Cancer (HCC)    skin cancer   Chronic back pain    chronic back pain- see pain management MD   Diverticulitis    had tiny perforation of bowel- 04/2014   GI bleed 2008   Gout    Hypertension    PONV (postoperative nausea  and vomiting)    better with recent surgeries   Swallowing difficulty    can swallow, but often times has to spit up" bring up a lot of foamy tough"   Transfusion history    x2 episodes -post procedure polypectomies at different times under 2 yrs ago    Current Outpatient Medications on File Prior to Visit  Medication Sig Dispense Refill   aspirin EC 81 MG tablet Take 1 tablet (81 mg total) by mouth daily. Swallow whole.     carvedilol (COREG) 12.5 MG tablet TAKE 1 TABLET (12.5 MG TOTAL) BY MOUTH 2 (TWO) TIMES DAILY. NEED OV. 15 tablet 0   docusate sodium (COLACE) 100 MG capsule Take 100 mg by mouth daily.     losartan (COZAAR) 50 MG tablet Take 1 tablet (50 mg total) by mouth daily. 90 tablet 3   naproxen sodium (ANAPROX) 220 MG tablet Take 440 mg by mouth daily as needed (for pain.).     oxyCODONE (ROXICODONE) 15 MG immediate release tablet Take 20 mg by mouth every 12 (twelve) hours.     tamsulosin (FLOMAX) 0.4 MG CAPS capsule Take 0.4 mg by mouth daily after supper.     zolpidem (AMBIEN) 10 MG tablet Take 10 mg by mouth at bedtime.     No current facility-administered medications on file prior  to visit.    Allergies  Allergen Reactions   Flagyl [Metronidazole] Hives    Reported by patient's wife   Amlodipine     Other reaction(s): Other   Penicillins Hives    Has patient had a PCN reaction causing immediate rash, facial/tongue/throat swelling, SOB or lightheadedness with hypotension: Yes Has patient had a PCN reaction causing severe rash involving mucus membranes or skin necrosis: No Has patient had a PCN reaction that required hospitalization No Has patient had a PCN reaction occurring within the last 10 years: Yes If all of the above answers are "NO", then may proceed with Cephalosporin use.  HAS tolerated meropenem (09/2015)   Allopurinol Swelling   Amlodipine Besy-Benazepril Hcl     UNKNOWN   Ancef [Cefazolin] Hives   Dexilant [Dexlansoprazole] Nausea And Vomiting    Vioxx [Rofecoxib] Swelling    Assessment/Plan:  1. Hyperlipidemia - Patient last LDL 77 which is above goal of <55. Aggressive goal due to diffuse CAD and elevated coronary calcium score. Since patient is intolerant to statins, recommend starting PCSK9i.  Using demo pen, educated patient on mechanism of action, storage, site selection, administration, and possible side effects. Will complete PA and contact patient with result. Recheck fasting lipid panel in 2-3 months.  Start Repatha/Praluent q 2 weeks Recheck lipid panel in 2-3 months  Laural Golden, PharmD, BCACP, CDCES, CPP [address] [place], [place], [address] Phone: [phone], Fax: [phone]

## 2023-05-30 NOTE — Telephone Encounter (Signed)
 Pharmacy Patient Advocate Encounter   Received notification from Pt Calls Messages that prior authorization for repatha is required/requested.   Insurance verification completed.   The patient is insured through [facility] .   Per test claim: PA required; PA submitted to above mentioned insurance via CoverMyMeds Key/confirmation #/EOC [id number] Status is pending

## 2023-05-30 NOTE — Addendum Note (Signed)
 Addended by: Cheree Ditto on: 05/30/2023 01:41 PM   Modules accepted: Orders

## 2023-05-30 NOTE — Telephone Encounter (Signed)
 Please complete PA for Repatha/Praluent

## 2023-05-30 NOTE — Telephone Encounter (Signed)
 Pharmacy Patient Advocate Encounter  Received notification from [facility] that Prior Authorization for repatha has been APPROVED from 05/30/23 to 05/29/24. Ran test claim, Copay is $45.00. This test claim was processed through [facility]- copay amounts may vary at other pharmacies due to pharmacy/plan contracts, or as the patient moves through the different stages of their insurance plan.   PA #/Case ID/Reference #: [id number]

## 2023-06-24 ENCOUNTER — Other Ambulatory Visit: Payer: Self-pay | Admitting: Cardiology

## 2023-06-24 ENCOUNTER — Telehealth: Payer: Self-pay | Admitting: Cardiology

## 2023-06-24 MED ORDER — LOSARTAN POTASSIUM 50 MG PO TABS: 50.0000 mg | ORAL_TABLET | Freq: Every day | ORAL | 2 refills | Status: DC

## 2023-06-24 NOTE — Telephone Encounter (Signed)
 Spoke with patient and he states he has been taking 100 mg of losartan  by accident.  His dose was increased at last visit and he had 25 mg tablets so he was taking 2 tablets. When he went to pick up refill he didn't realize the dose increased to 50 mg so he was taking 2 tablets a day.  He now need a refill and insurance will not cover because its too early to refill. He will see how much it will cost out of pocket . He states he didn't have any energy while taking 100 mg and one day his BP dropped to 90/56. He will continue to monitor BP . ED precautions discussed

## 2023-06-24 NOTE — Telephone Encounter (Signed)
 RX sent to requested Pharmacy

## 2023-06-24 NOTE — Telephone Encounter (Signed)
  Pt c/o medication issue:  1. Name of Medication: losartan  (COZAAR ) 50 MG tablet   2. How are you currently taking this medication (dosage and times per day)? Take 1 tablet (50 mg total) by mouth daily.   3. Are you having a reaction (difficulty breathing--STAT)? No   4. What is your medication issue? Pt mistakenly been taking this twice a day. He thought what he got was 25 mg. Pt been taking total of 100 mg day

## 2023-06-24 NOTE — Telephone Encounter (Signed)
*  STAT* If patient is at the pharmacy, call can be transferred to refill team.   1. Which medications need to be refilled? (please list name of each medication and dose if known) Losartan  25 mg- need a new prescription - dose was changed and he used up his last refill   2. Would you like to learn more about the convenience, safety, & potential cost savings by using the [facility]?      3. Are you open to using the [facility] (Type [facility].   4. Which pharmacy/location (including street and city if local pharmacy) is medication to be sent to? [facility] [place], [place]   5. Do they need a 30 day or 90 day supply? 90 days and refills- please call today- out of medicine

## 2023-06-25 NOTE — Telephone Encounter (Signed)
 Spoke to patient Dr.Jordan's advice given.He will take Losartan  50 mg daily.

## 2023-08-04 ENCOUNTER — Encounter: Payer: Self-pay | Admitting: Cardiology

## 2023-08-05 NOTE — Telephone Encounter (Signed)
 Please review recent testing and advise pt of any recommendations.

## 2023-08-09 ENCOUNTER — Other Ambulatory Visit: Payer: Self-pay | Admitting: *Deleted

## 2023-08-09 MED ORDER — LOSARTAN POTASSIUM 50 MG PO TABS: 100.0000 mg | ORAL_TABLET | Freq: Every day | ORAL | 3 refills | Status: DC

## 2023-08-09 NOTE — Progress Notes (Signed)
 Called patient and patient requested Losartan  100 mg to sent to pharmacy. Made patient aware to continue to check blood pressure and per [name] would increase losartan  to 100 mg daily . Made patient aware  per [name] that BP control is key. we will monitor aortic size over time. Patient verbalized an understanding.

## 2023-10-06 ENCOUNTER — Other Ambulatory Visit: Payer: Self-pay | Admitting: Cardiology

## 2023-10-06 DIAGNOSIS — I25118 Atherosclerotic heart disease of native coronary artery with other forms of angina pectoris: Secondary | ICD-10-CM

## 2023-10-06 DIAGNOSIS — E7800 Pure hypercholesterolemia, unspecified: Secondary | ICD-10-CM

## 2023-10-06 DIAGNOSIS — R931 Abnormal findings on diagnostic imaging of heart and coronary circulation: Secondary | ICD-10-CM

## 2023-12-19 ENCOUNTER — Other Ambulatory Visit (HOSPITAL_BASED_OUTPATIENT_CLINIC_OR_DEPARTMENT_OTHER): Payer: Self-pay

## 2023-12-19 MED ORDER — COMIRNATY 30 MCG/0.3ML IM SUSY
0.3000 mL | PREFILLED_SYRINGE | Freq: Once | INTRAMUSCULAR | 0 refills | Status: AC
  Filled 2023-12-19: qty 0.3, 1d supply, fill #0

## 2023-12-19 MED ORDER — FLUZONE HIGH-DOSE 0.5 ML IM SUSY
0.5000 mL | PREFILLED_SYRINGE | Freq: Once | INTRAMUSCULAR | 0 refills | Status: AC
  Filled 2023-12-19: qty 0.5, 1d supply, fill #0

## 2024-02-18 ENCOUNTER — Other Ambulatory Visit: Payer: Self-pay | Admitting: Pharmacist Clinician (PhC)/ Clinical Pharmacy Specialist

## 2024-02-18 DIAGNOSIS — R931 Abnormal findings on diagnostic imaging of heart and coronary circulation: Secondary | ICD-10-CM

## 2024-02-18 DIAGNOSIS — I25118 Atherosclerotic heart disease of native coronary artery with other forms of angina pectoris: Secondary | ICD-10-CM

## 2024-02-18 DIAGNOSIS — E7800 Pure hypercholesterolemia, unspecified: Secondary | ICD-10-CM

## 2024-02-18 MED ORDER — REPATHA SURECLICK 140 MG/ML ~~LOC~~ SOAJ: 1.0000 mL | SUBCUTANEOUS | 0 refills | Status: AC

## 2024-02-28 ENCOUNTER — Other Ambulatory Visit: Payer: Self-pay | Admitting: Cardiology
